# Patient Record
Sex: Female | Born: 1985 | Race: White | Hispanic: No | Marital: Single | Smoking: Never smoker
Health system: Southern US, Community
[De-identification: ages and names within clinical notes are randomized; demographics above are authoritative.]

## PROBLEM LIST (undated history)

## (undated) DIAGNOSIS — R519 Headache, unspecified: Secondary | ICD-10-CM

## (undated) DIAGNOSIS — E669 Obesity, unspecified: Secondary | ICD-10-CM

## (undated) DIAGNOSIS — F329 Major depressive disorder, single episode, unspecified: Secondary | ICD-10-CM

## (undated) DIAGNOSIS — R51 Headache: Secondary | ICD-10-CM

## (undated) DIAGNOSIS — F32A Depression, unspecified: Secondary | ICD-10-CM

## (undated) HISTORY — DX: Depression, unspecified: F32.A

## (undated) HISTORY — PX: NO PAST SURGERIES: SHX2092

## (undated) HISTORY — DX: Headache: R51

## (undated) HISTORY — DX: Headache, unspecified: R51.9

## (undated) HISTORY — DX: Obesity, unspecified: E66.9

## (undated) HISTORY — DX: Major depressive disorder, single episode, unspecified: F32.9

---

## 2010-09-11 ENCOUNTER — Emergency Department: Payer: Self-pay | Admitting: Emergency Medicine

## 2016-03-28 ENCOUNTER — Ambulatory Visit (INDEPENDENT_AMBULATORY_CARE_PROVIDER_SITE_OTHER): Payer: BLUE CROSS/BLUE SHIELD | Admitting: Physician Assistant

## 2016-03-28 ENCOUNTER — Encounter: Payer: Self-pay | Admitting: Physician Assistant

## 2016-03-28 VITALS — BP 122/78 | HR 76 | Temp 98.6°F | Resp 16 | Ht 65.0 in | Wt 253.0 lb

## 2016-03-28 DIAGNOSIS — Z114 Encounter for screening for human immunodeficiency virus [HIV]: Secondary | ICD-10-CM | POA: Diagnosis not present

## 2016-03-28 DIAGNOSIS — Z136 Encounter for screening for cardiovascular disorders: Secondary | ICD-10-CM | POA: Diagnosis not present

## 2016-03-28 DIAGNOSIS — Z833 Family history of diabetes mellitus: Secondary | ICD-10-CM

## 2016-03-28 DIAGNOSIS — Z13220 Encounter for screening for lipoid disorders: Secondary | ICD-10-CM | POA: Diagnosis not present

## 2016-03-28 DIAGNOSIS — Z0000 Encounter for general adult medical examination without abnormal findings: Secondary | ICD-10-CM | POA: Diagnosis not present

## 2016-03-28 DIAGNOSIS — R590 Localized enlarged lymph nodes: Secondary | ICD-10-CM

## 2016-03-28 DIAGNOSIS — Z124 Encounter for screening for malignant neoplasm of cervix: Secondary | ICD-10-CM | POA: Diagnosis not present

## 2016-03-28 DIAGNOSIS — Z803 Family history of malignant neoplasm of breast: Secondary | ICD-10-CM

## 2016-03-28 NOTE — Patient Instructions (Signed)

## 2016-03-28 NOTE — Addendum Note (Signed)
Addended by: Margaretann LovelessBURNETTE, Hawkin Charo M on: 03/28/2016 03:37 PM   Modules accepted: Orders

## 2016-03-28 NOTE — Progress Notes (Signed)
Patient: Rebecca Black Female    DOB: 1985-05-16   31 y.o.   MRN: [id number] Visit Date: 03/28/2016  Today's Provider: Margaretann Loveless, PA-C   Chief Complaint  Patient presents with  . New Patient (Initial Visit)  . Annual Exam   Subjective:    HPI Patient here to establish care, pt reports she has not had a physical for about 7 or 8 years.   Patient reports she is feeling well. Patient is exercises 2-3 times a week. Patient reports sleeping fairly well.   Her main complaint is that she is having a lymph node in the left axilla that has become more prominent over the last couple of months. She thinks she noticed it originally in Oct or Nov. It is only tender if she touches it often. She denies any changes in size since she first noted it. No breast nodules or lumps noted. No nipple discharge. No breast pain. No asymmetry. There is a past medical history of breast cancer in her paternal grandmother.      No Known Allergies  No current outpatient prescriptions on file.  Review of Systems  Constitutional: Negative.  Negative for appetite change.  HENT: Negative.   Eyes: Negative.   Respiratory: Negative.   Cardiovascular: Positive for leg swelling (only when she is up on her feet all day).  Gastrointestinal: Negative.   Endocrine: Negative.   Genitourinary: Negative.   Musculoskeletal: Positive for arthralgias.  Skin: Negative.   Allergic/Immunologic: Negative.   Neurological: Negative.   Hematological: Negative.   Psychiatric/Behavioral: Negative.     Social History  Substance Use Topics  . Smoking status: Never Smoker  . Smokeless tobacco: Never Used  . Alcohol use 0.6 oz/week    1 Glasses of wine per week   Objective:   BP 122/78 (BP Location: Left Arm, Patient Position: Sitting, Cuff Size: Large)   Pulse 76   Temp 98.6 F (37 C) (Oral)   Resp 16   Ht 5\' 5"  (1.651 m)   Wt 253 lb (114.8 kg)   LMP 03/23/2016 (Exact Date)   BMI 42.10 kg/m    Physical Exam  Constitutional: She is oriented to person, place, and time. She appears well-developed and well-nourished. No distress.  HENT:  Head: Normocephalic and atraumatic.  Right Ear: Hearing, tympanic membrane, external ear and ear canal normal.  Left Ear: Hearing, tympanic membrane, external ear and ear canal normal.  Nose: Nose normal.  Mouth/Throat: Uvula is midline, oropharynx is clear and moist and mucous membranes are normal. No oropharyngeal exudate.  Eyes: Conjunctivae and EOM are normal. Pupils are equal, round, and reactive to light. Right eye exhibits no discharge. Left eye exhibits no discharge. No scleral icterus.  Neck: Normal range of motion. Neck supple. No JVD present. Carotid bruit is not present. No tracheal deviation present. No thyromegaly present.  Cardiovascular: Normal rate, regular rhythm, normal heart sounds and intact distal pulses.  Exam reveals no gallop and no friction rub.   No murmur heard. Pulmonary/Chest: Effort normal and breath sounds normal. No respiratory distress. She has no wheezes. She has no rales. She exhibits no tenderness. Right breast exhibits no inverted nipple, no mass, no nipple discharge, no skin change and no tenderness. Left breast exhibits no inverted nipple, no mass, no nipple discharge, no skin change and no tenderness. Breasts are symmetrical.  Abdominal: Soft. Bowel sounds are normal. She exhibits no distension and no mass. There is no tenderness. There is  no rebound and no guarding. Hernia confirmed negative in the right inguinal area and confirmed negative in the left inguinal area.  Genitourinary: Rectum normal, vagina normal and uterus normal. No breast swelling, tenderness, discharge or bleeding. Pelvic exam was performed with patient supine. There is no rash, tenderness, lesion or injury on the right labia. There is no rash, tenderness, lesion or injury on the left labia. Cervix exhibits no motion tenderness, no discharge and no  friability. Right adnexum displays no mass, no tenderness and no fullness. Left adnexum displays no mass, no tenderness and no fullness. No erythema, tenderness or bleeding in the vagina. No signs of injury around the vagina. No vaginal discharge found.  Musculoskeletal: Normal range of motion. She exhibits no edema or tenderness.  Lymphadenopathy:    She has no cervical adenopathy.    She has axillary adenopathy.       Left axillary: Lateral (slightly enlarged, non-tender) adenopathy present.       Right: No inguinal adenopathy present.       Left: No inguinal adenopathy present.  Neurological: She is alert and oriented to person, place, and time. She has normal reflexes. No cranial nerve deficit. Coordination normal.  Skin: Skin is warm and dry. No rash noted. She is not diaphoretic.  Psychiatric: She has a normal mood and affect. Her behavior is normal. Judgment and thought content normal.  Vitals reviewed.      Assessment & Plan:     1. Annual physical exam Normal physical exam today. Will check labs as below and f/u pending lab results. If labs are stable and WNL she will not need to have these rechecked for one year at her next annual physical exam. She is to call the office in the meantime if she has any acute issue, questions or concerns. - CBC with Differential - Comprehensive metabolic panel - TSH  2. Cervical cancer screening Pap collected today. Will send as below and f/u pending results. - Pap IG w/ reflex to HPV when [facility]-U ([facility])  3. Screening for HIV (human immunodeficiency virus) - HIV antibody  4. Axillary lymphadenopathy Slightly enlarged lateral axillary lymph node in the left axilla. Will get US as below for further evaluation. Patient does have family history of breast cancer in her paternal grandmother. No breast changes or nodules palpated. Otherwise unremarkable breast exam today. - US BREAST COMPLETE UNI LEFT INC AXILLA; Future  5. Family  history of breast cancer See above medical treatment plan. - US BREAST COMPLETE UNI LEFT INC AXILLA; Future  6. Family history of diabetes mellitus Will check labs as below and f/u pending results. - Hemoglobin A1c  7. Encounter for lipid screening for cardiovascular disease Will check labs as below and f/u pending results. - Lipid panel       Margaretann LovelessJennifer M Charan Prieto, PA-C  [facility]

## 2016-03-29 ENCOUNTER — Telehealth: Payer: Self-pay

## 2016-03-29 LAB — LIPID PANEL
CHOL/HDL RATIO: 2.7 ratio (ref 0.0–4.4)
CHOLESTEROL TOTAL: 145 mg/dL (ref 100–199)
HDL: 54 mg/dL (ref >39)
LDL CALC: 81 mg/dL (ref 0–99)
Triglycerides: 48 mg/dL (ref 0–149)
VLDL CHOLESTEROL CAL: 10 mg/dL (ref 5–40)

## 2016-03-29 LAB — COMPREHENSIVE METABOLIC PANEL
A/G RATIO: 1.4 (ref 1.2–2.2)
ALBUMIN: 4.3 g/dL (ref 3.5–5.5)
ALT: 19 IU/L (ref 0–32)
AST: 24 IU/L (ref 0–40)
Alkaline Phosphatase: 58 IU/L (ref 39–117)
BUN / CREAT RATIO: 29 — AB (ref 9–23)
BUN: 20 mg/dL (ref 6–20)
Bilirubin Total: 0.3 mg/dL (ref 0.0–1.2)
CALCIUM: 8.9 mg/dL (ref 8.7–10.2)
CO2: 25 mmol/L (ref 18–29)
Chloride: 99 mmol/L (ref 96–106)
Creatinine, Ser: 0.69 mg/dL (ref 0.57–1.00)
GFR, EST AFRICAN AMERICAN: 136 mL/min/{1.73_m2} (ref >59)
GFR, EST NON AFRICAN AMERICAN: 118 mL/min/{1.73_m2} (ref >59)
GLOBULIN, TOTAL: 3 g/dL (ref 1.5–4.5)
Glucose: 91 mg/dL (ref 65–99)
POTASSIUM: 4.2 mmol/L (ref 3.5–5.2)
Sodium: 138 mmol/L (ref 134–144)
TOTAL PROTEIN: 7.3 g/dL (ref 6.0–8.5)

## 2016-03-29 LAB — CBC WITH DIFFERENTIAL/PLATELET
BASOS: 0 %
Basophils Absolute: 0 10*3/uL (ref 0.0–0.2)
EOS (ABSOLUTE): 0 10*3/uL (ref 0.0–0.4)
EOS: 0 %
HEMATOCRIT: 41 % (ref 34.0–46.6)
HEMOGLOBIN: 13.2 g/dL (ref 11.1–15.9)
IMMATURE GRANS (ABS): 0 10*3/uL (ref 0.0–0.1)
IMMATURE GRANULOCYTES: 0 %
LYMPHS: 19 %
Lymphocytes Absolute: 1.9 10*3/uL (ref 0.7–3.1)
MCH: 28.6 pg (ref 26.6–33.0)
MCHC: 32.2 g/dL (ref 31.5–35.7)
MCV: 89 fL (ref 79–97)
Monocytes Absolute: 0.6 10*3/uL (ref 0.1–0.9)
Monocytes: 6 %
NEUTROS ABS: 7.1 10*3/uL — AB (ref 1.4–7.0)
NEUTROS PCT: 75 %
PLATELETS: 275 10*3/uL (ref 150–379)
RBC: 4.62 x10E6/uL (ref 3.77–5.28)
RDW: 13.6 % (ref 12.3–15.4)
WBC: 9.6 10*3/uL (ref 3.4–10.8)

## 2016-03-29 LAB — PAP IG W/ RFLX HPV ASCU: PAP SMEAR COMMENT: 0

## 2016-03-29 LAB — HIV ANTIBODY (ROUTINE TESTING W REFLEX): HIV SCREEN 4TH GENERATION: NONREACTIVE

## 2016-03-29 LAB — TSH: TSH: 3.1 u[IU]/mL (ref 0.450–4.500)

## 2016-03-29 LAB — HEMOGLOBIN A1C
Est. average glucose Bld gHb Est-mCnc: 97 mg/dL
Hgb A1c MFr Bld: 5 % (ref 4.8–5.6)

## 2016-03-29 NOTE — Telephone Encounter (Signed)
Advised pt of lab results. Pt verbally acknowledges understanding. Rehaan Viloria Drozdowski, CMA   

## 2016-03-29 NOTE — Telephone Encounter (Signed)
-----   Message from Margaretann LovelessJennifer M Burnette, PA-C sent at 03/29/2016  8:25 AM EST ----- All labs are within normal limits and stable.  Thanks! -JB

## 2016-04-01 ENCOUNTER — Telehealth: Payer: Self-pay

## 2016-04-01 NOTE — Telephone Encounter (Signed)
-----   Message from Margaretann LovelessJennifer M Burnette, [place]-C sent at 03/29/2016  5:21 PM EST ----- Pap is normal.  Will repeat in 3 years.

## 2016-04-01 NOTE — Telephone Encounter (Signed)
Patient advised as directed below.  Thanks,  -Nicoli Nardozzi 

## 2016-04-11 ENCOUNTER — Ambulatory Visit: Payer: Self-pay

## 2016-04-18 ENCOUNTER — Ambulatory Visit
Admission: RE | Admit: 2016-04-18 | Discharge: 2016-04-18 | Disposition: A | Discharge status: Home / Self Care | Payer: BLUE CROSS/BLUE SHIELD | Source: Ambulatory Visit | Attending: Physician Assistant | Admitting: Physician Assistant

## 2016-04-18 DIAGNOSIS — Z803 Family history of malignant neoplasm of breast: Secondary | ICD-10-CM | POA: Diagnosis present

## 2016-04-18 DIAGNOSIS — R590 Localized enlarged lymph nodes: Secondary | ICD-10-CM | POA: Insufficient documentation

## 2017-06-25 IMAGING — US US BREAST*L* LIMITED INC AXILLA
1 series · 4 of 4 positions shown · non-contrast
Comparison: None.

CLINICAL DATA: Left axillary area of palpable concern and
clinically detected enlargement.

EXAM:
ULTRASOUND OF THE LEFT BREAST

[Series 1: us breast*left* limited inc axilla · 0.06mm/px · 4 of 4 slices shown]
[im 1/4]
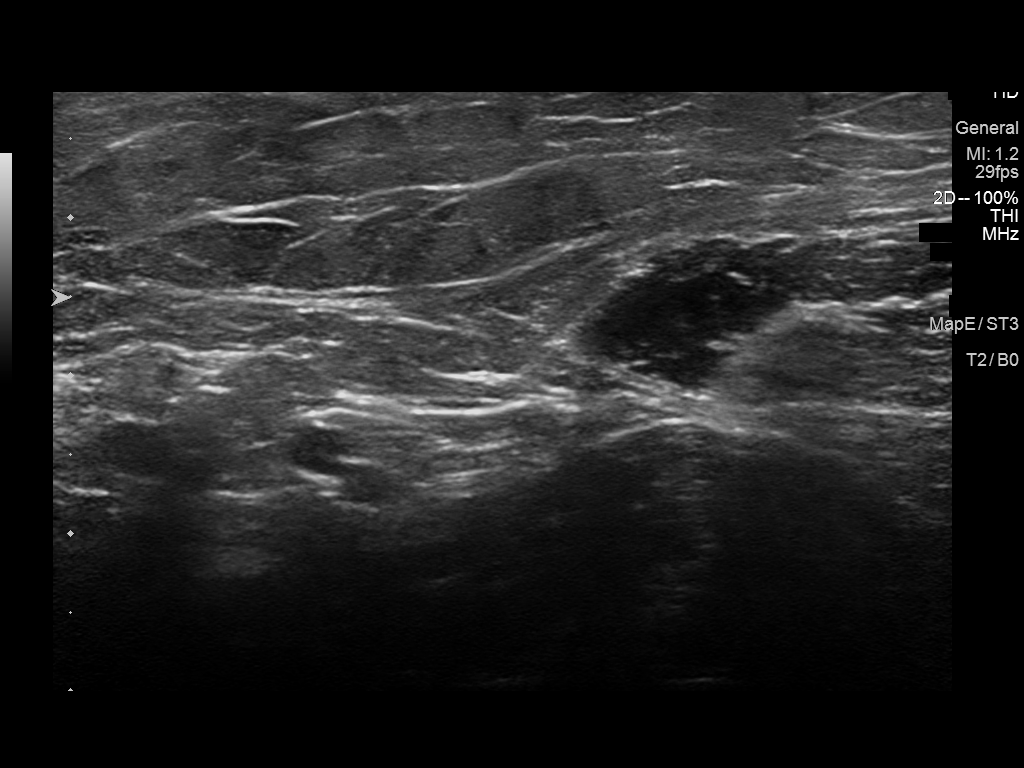
[im 2/4]
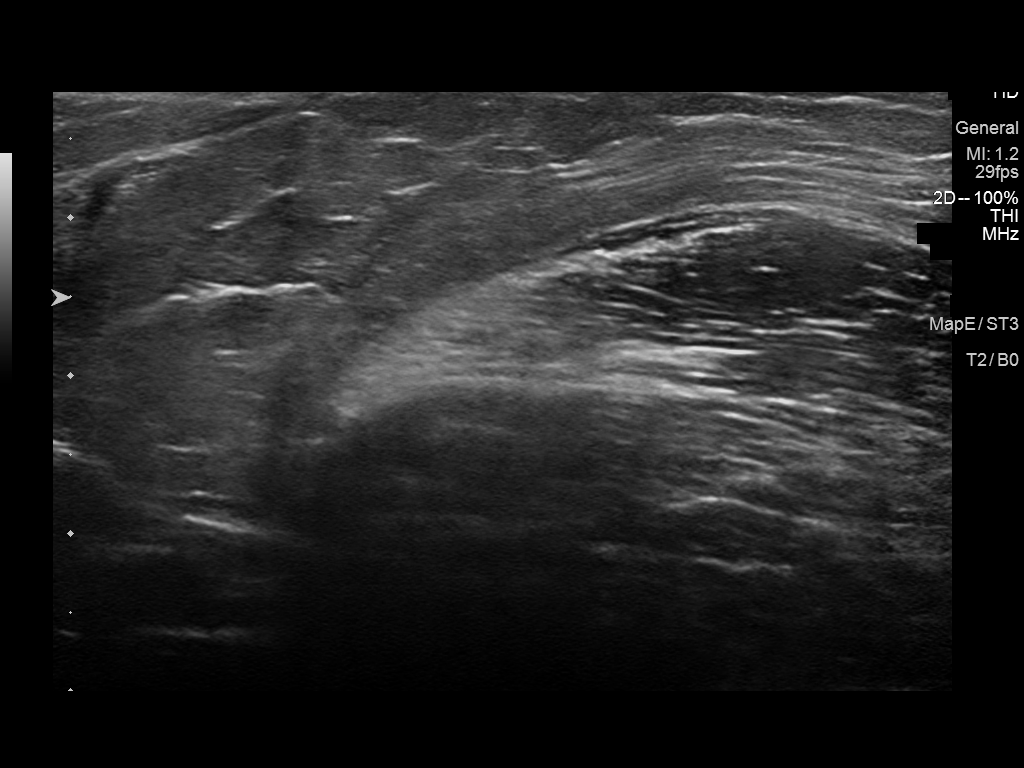
[im 3/4]
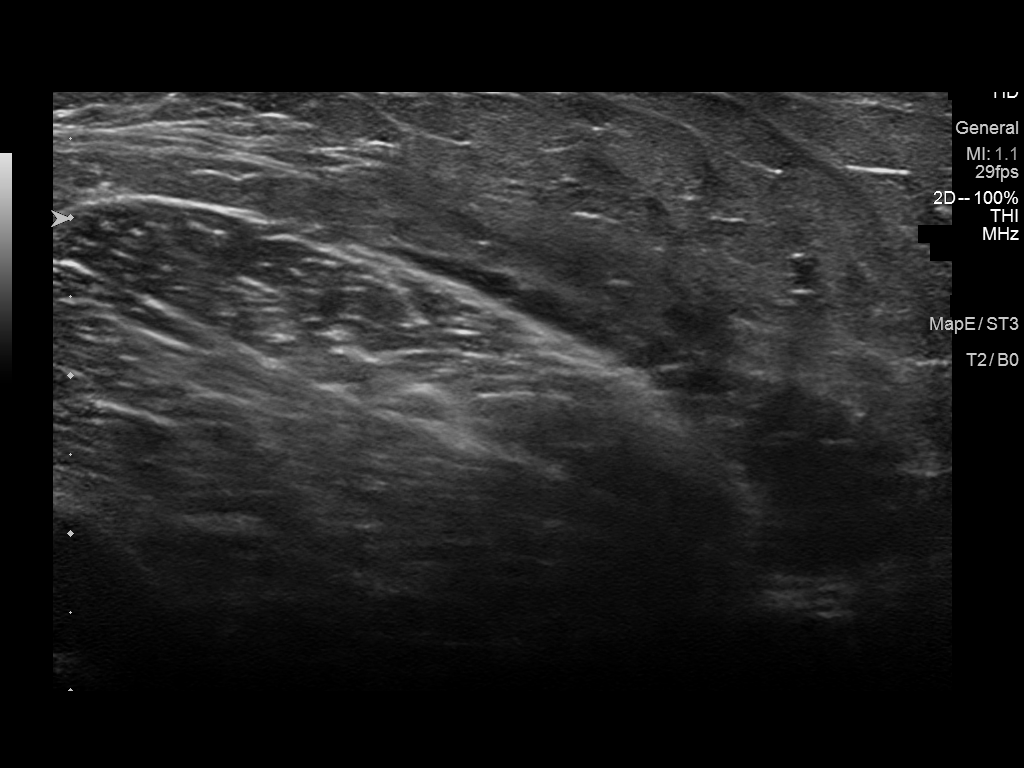
[im 4/4]
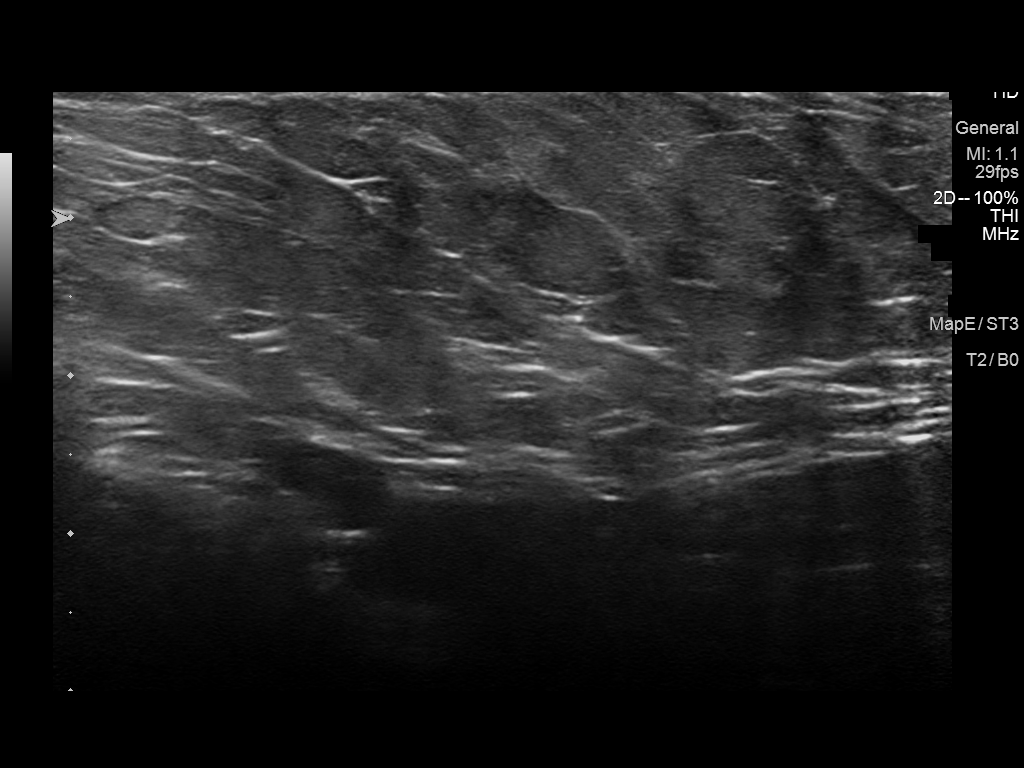

[4 of 4 positions shown; findings below may reference images not displayed]

FINDINGS: On physical exam, no suspicious masses are palpated in the left
axilla.

Targeted ultrasound is performed, showing no suspicious masses or
evidence of lymphadenopathy.
IMPRESSION: Normal targeted left axillary ultrasound.

RECOMMENDATION:
Screening mammogram at age 41 unless there are persistent or
intervening clinical concerns. (Code:PB-[id number])

Further management of patient's area of palpable concern in the left
axilla should be based on clinical grounds.

I have discussed the findings and recommendations with the patient.
Results were also provided in writing at the conclusion of the
visit. If applicable, a reminder letter will be sent to the patient
regarding the next appointment.

BI-RADS CATEGORY  1: Negative.

## 2021-08-24 NOTE — Progress Notes (Signed)
New patient visit   Patient: Rebecca Black   DOB: March 20, 1986   36 y.o. Female  MRN: [id number] Visit Date: 08/28/2021  Today's healthcare provider: Gwyneth Sprout, FNP  Patient presents for new patient visit to establish care.  Introduced to Designer, jewellery role and practice setting.  All questions answered.  Discussed provider/patient relationship and expectations.   I,Tiffany J Bragg,acting as a scribe for Gwyneth Sprout, FNP.,have documented all relevant documentation on the behalf of Gwyneth Sprout, FNP,as directed by  Gwyneth Sprout, FNP while in the presence of Gwyneth Sprout, FNP.   Chief Complaint  Patient presents with   Annual Exam   Foot Swelling    Patient complains of R foot swelling, bruising and spider veins for 6 months.   Subjective    ALEYAH Black is a 36 y.o. female who presents today as a new patient to establish care.  HPI HPI     Foot Swelling    Additional comments: Patient complains of R foot swelling, bruising and spider veins for 6 months.      Last edited by Smitty Knudsen, CMA on 08/28/2021  2:05 PM.       History reviewed. No pertinent past medical history.  History reviewed. No pertinent surgical history. Family Status  Relation Name Status   Mother  Alive   Father  Alive   Sister  Alive   PGM  (Not Specified)   PGF  (Not Specified)   Family History  Problem Relation Age of Onset   Depression Mother    Hypertension Mother    Diabetes Father    Breast cancer Paternal Grandmother    Diabetes Paternal Grandfather    Social History   Socioeconomic History   Marital status: Single    Spouse name: Not on file   Number of children: 0   Years of education: 12   Highest education level: Not on file  Occupational History    Employer: [employer]  Tobacco Use   Smoking status: Never   Smokeless tobacco: Never  Substance and Sexual Activity   Alcohol use: Yes    Alcohol/week: 1.0 standard drink    Types: 1 Glasses of wine per  week   Drug use: No   Sexual activity: Never  Other Topics Concern   Not on file  Social History Narrative   Not on file   Social Determinants of Health   Financial Resource Strain: Not on file  Food Insecurity: Not on file  Transportation Needs: Not on file  Physical Activity: Not on file  Stress: Not on file  Social Connections: Not on file   No outpatient medications prior to visit.   No facility-administered medications prior to visit.   No Known Allergies   There is no immunization history on file for this patient.  Health Maintenance  Topic Date Due   COVID-19 Vaccine (1) Never done   Hepatitis C Screening  Never done   TETANUS/TDAP  Never done   PAP SMEAR-Modifier  03/29/2019   INFLUENZA VACCINE  10/23/2021   HIV Screening  Completed   HPV VACCINES  Aged Out    Patient Care Team: Gwyneth Sprout, FNP as PCP - General (Family Medicine)  Review of Systems  Cardiovascular:  Positive for leg swelling.  Musculoskeletal:  Positive for arthralgias and back pain.  Neurological:  Positive for light-headedness.  All other systems reviewed and are negative.     Objective  BP 134/66 (BP Location: Right Arm, Patient Position: Sitting, Cuff Size: Large)   Pulse 75   Temp 98.5 F (36.9 C) (Oral)   Resp 16   Ht 5\' 4"  (1.626 m)   Wt (!) 300 lb 4.8 oz (136.2 kg)   LMP 08/28/2021   SpO2 100%   BMI 51.55 kg/m    Physical Exam Vitals and nursing note reviewed.  Constitutional:      General: She is awake. She is not in acute distress.    Appearance: Normal appearance. She is well-developed and well-groomed. She is obese. She is not ill-appearing, toxic-appearing or diaphoretic.  HENT:     Head: Normocephalic and atraumatic.     Jaw: There is normal jaw occlusion. No trismus, tenderness, swelling or pain on movement.     Right Ear: Hearing, tympanic membrane, ear canal and external ear normal. There is no impacted cerumen.     Left Ear: Hearing, tympanic  membrane, ear canal and external ear normal. There is no impacted cerumen.     Nose: Nose normal. No congestion or rhinorrhea.     Right Turbinates: Not enlarged, swollen or pale.     Left Turbinates: Not enlarged, swollen or pale.     Right Sinus: No maxillary sinus tenderness or frontal sinus tenderness.     Left Sinus: No maxillary sinus tenderness or frontal sinus tenderness.     Mouth/Throat:     Lips: Pink.     Mouth: Mucous membranes are moist. No injury.     Tongue: No lesions.     Pharynx: Oropharynx is clear. Uvula midline. No pharyngeal swelling, oropharyngeal exudate, posterior oropharyngeal erythema or uvula swelling.     Tonsils: No tonsillar exudate or tonsillar abscesses.  Eyes:     General: Lids are normal. Lids are everted, no foreign bodies appreciated. Vision grossly intact. Gaze aligned appropriately. No allergic shiner or visual field deficit.       Right eye: No discharge.        Left eye: No discharge.     Extraocular Movements: Extraocular movements intact.     Conjunctiva/sclera: Conjunctivae normal.     Right eye: Right conjunctiva is not injected. No exudate.    Left eye: Left conjunctiva is not injected. No exudate.    Pupils: Pupils are equal, round, and reactive to light.  Neck:     Thyroid: No thyroid mass, thyromegaly or thyroid tenderness.     Vascular: No carotid bruit.     Trachea: Trachea normal.  Cardiovascular:     Rate and Rhythm: Normal rate and regular rhythm.     Pulses: Normal pulses.          Carotid pulses are 2+ on the right side and 2+ on the left side.      Radial pulses are 2+ on the right side and 2+ on the left side.       Dorsalis pedis pulses are 2+ on the right side and 2+ on the left side.       Posterior tibial pulses are 2+ on the right side and 2+ on the left side.     Heart sounds: Normal heart sounds, S1 normal and S2 normal. No murmur heard.   No friction rub. No gallop.  Pulmonary:     Effort: Pulmonary effort is  normal. No respiratory distress.     Breath sounds: Normal breath sounds and air entry. No stridor. No wheezing, rhonchi or rales.  Chest:     Chest  wall: No tenderness.     Comments: Breasts: risk and benefit of breast self-exam was discussed, not examined, patient declines to have breast exam  Abdominal:     General: Abdomen is flat. Bowel sounds are normal. There is no distension.     Palpations: Abdomen is soft. There is no mass.     Tenderness: There is no abdominal tenderness. There is no right CVA tenderness, left CVA tenderness, guarding or rebound.     Hernia: No hernia is present.  Genitourinary:    Comments: Exam deferred; denies complaints Musculoskeletal:        General: No swelling, tenderness, deformity or signs of injury. Normal range of motion.     Cervical back: Full passive range of motion without pain, normal range of motion and neck supple. No edema, rigidity or tenderness. No muscular tenderness.     Right lower leg: No edema.     Left lower leg: No edema.  Lymphadenopathy:     Cervical: No cervical adenopathy.     Right cervical: No superficial, deep or posterior cervical adenopathy.    Left cervical: No superficial, deep or posterior cervical adenopathy.  Skin:    General: Skin is warm and dry.     Capillary Refill: Capillary refill takes less than 2 seconds.     Coloration: Skin is not jaundiced or pale.     Findings: No bruising, erythema, lesion or rash.  Neurological:     General: No focal deficit present.     Mental Status: She is alert and oriented to person, place, and time. Mental status is at baseline.     GCS: GCS eye subscore is 4. GCS verbal subscore is 5. GCS motor subscore is 6.     Sensory: Sensation is intact. No sensory deficit.     Motor: Motor function is intact. No weakness.     Coordination: Coordination is intact. Coordination normal.     Gait: Gait is intact. Gait normal.  Psychiatric:        Attention and Perception: Attention and  perception normal.        Mood and Affect: Mood and affect normal.        Speech: Speech normal.        Behavior: Behavior normal. Behavior is cooperative.        Thought Content: Thought content normal.        Cognition and Memory: Cognition and memory normal.        Judgment: Judgment normal.    Depression Screen    08/28/2021    2:00 PM 03/28/2016    2:12 PM  PHQ 2/9 Scores  PHQ - 2 Score 3 1  PHQ- 9 Score 8    No results found for any visits on 08/28/21.  Assessment & Plan      Problem List Items Addressed This Visit       Cardiovascular and Mediastinum   Spider varicose veins    Acute on chronic, stable -will refer to vascular for [place] -encourage use of compression stocking -encourage increase in water -encourage low-salt diet -recommend support shoes/wide toe box Reassurance provided, normal temperature, normal pulses, non pitting presentation. Nothing is red/inflammed, warm/tight, hard.        Relevant Orders   Ambulatory referral to Vascular Surgery     Other   Annual physical exam - Primary    Recommend annual vision and dental screening. Things to do to keep yourself healthy  - Exercise at least 30-45 minutes a  day, 3-4 days a week.  - Eat a low-fat diet with lots of fruits and vegetables, up to 7-9 servings per day.  - Seatbelts can save your life. Wear them always.  - Smoke detectors on every level of your home, check batteries every year.  - Eye Doctor - have an eye exam every 1-2 years  - Safe sex - if you may be exposed to STDs, use a condom.  - Alcohol -  If you drink, do it moderately, less than 2 drinks per day.  - [place]. Choose someone to speak for you if you are not able.  - Depression is common in our stressful world.If you're feeling down or losing interest in things you normally enjoy, please come in for a visit.  - Violence - If anyone is threatening or hurting you, please call immediately.         Relevant Orders    Comprehensive Metabolic Panel (CMET)   TSH + free T4   CBC with Differential/Platelet   Lipid panel   Encounter for hepatitis C screening test for low risk patient    Low risk screen Treatable, and curable. If left untreated Hep C can lead to cirrhosis and liver failure. Encourage routine testing; recommend repeat testing if risk factors change.       Relevant Orders   Hepatitis C Antibody   Epigastric pain    Chronic, intermittent, stable Worse with spicy food, can be triggered for up 2 weeks Eats 2 meals a day Does not eat until after noon, does drink water daily Continue to recommend balanced, lower carb meals. Smaller meal size, adding snacks. Choosing water as drink of choice and increasing purposeful exercise. Recommend treat as GERD Denies constipation       Relevant Medications   pantoprazole (PROTONIX) 40 MG tablet   sucralfate (CARAFATE) 1 g tablet   Family history of diabetes mellitus in father    Patient reports history of DM in father       Relevant Orders   Hemoglobin A1c   Family history of hypertension in mother    Patient reports her mom has HTN       Morbid obesity ([place])    Chronic, stable Body mass index is 51.55 kg/m. Discussed importance of healthy weight management Discussed diet and exercise Previously seen by bariatrics, and started on medication that assisted her in losing 10# Has thought about bariatric surgery but is still on the fence       Relevant Orders   Ambulatory referral to Vascular Surgery   Hemoglobin A1c   Lipid panel   Vitamin D (25 hydroxy)   Positive screening for depression on 9-item Patient Health Questionnaire (PHQ-9)    Chronic, stable Denies depression Reports feelings of frustration and anxiety when things aren't going her way Concerns when she puts in effort and it is not reciprocal          Return in about 1 year (around 08/29/2022) for annual examination.    Vonna Kotyk, FNP, have reviewed all  documentation for this visit. The documentation on 08/28/21 for the exam, diagnosis, procedures, and orders are all accurate and complete.    Gwyneth Sprout, [place] [phone] (phone) [phone] (fax)  [place]

## 2021-08-28 ENCOUNTER — Encounter: Payer: Self-pay | Admitting: Family Medicine

## 2021-08-28 ENCOUNTER — Ambulatory Visit: Payer: 59 | Admitting: Family Medicine

## 2021-08-28 VITALS — BP 134/66 | HR 75 | Temp 98.5°F | Resp 16 | Ht 64.0 in | Wt 300.3 lb

## 2021-08-28 DIAGNOSIS — Z1331 Encounter for screening for depression: Secondary | ICD-10-CM

## 2021-08-28 DIAGNOSIS — Z833 Family history of diabetes mellitus: Secondary | ICD-10-CM

## 2021-08-28 DIAGNOSIS — I868 Varicose veins of other specified sites: Secondary | ICD-10-CM | POA: Diagnosis not present

## 2021-08-28 DIAGNOSIS — R1013 Epigastric pain: Secondary | ICD-10-CM | POA: Diagnosis not present

## 2021-08-28 DIAGNOSIS — Z0000 Encounter for general adult medical examination without abnormal findings: Secondary | ICD-10-CM | POA: Diagnosis not present

## 2021-08-28 DIAGNOSIS — Z1159 Encounter for screening for other viral diseases: Secondary | ICD-10-CM

## 2021-08-28 DIAGNOSIS — Z8249 Family history of ischemic heart disease and other diseases of the circulatory system: Secondary | ICD-10-CM

## 2021-08-28 DIAGNOSIS — R609 Edema, unspecified: Secondary | ICD-10-CM | POA: Insufficient documentation

## 2021-08-28 MED ORDER — SUCRALFATE 1 G PO TABS: 1.0000 g | ORAL_TABLET | Freq: Three times a day (TID) | ORAL | 1 refills | Status: DC

## 2021-08-28 MED ORDER — PANTOPRAZOLE SODIUM 40 MG PO TBEC: 40.0000 mg | DELAYED_RELEASE_TABLET | Freq: Two times a day (BID) | ORAL | 1 refills | Status: DC

## 2021-08-28 NOTE — Assessment & Plan Note (Signed)
Chronic, intermittent, stable Worse with spicy food, can be triggered for up 2 weeks Eats 2 meals a day Does not eat until after noon, does drink water daily Continue to recommend balanced, lower carb meals. Smaller meal size, adding snacks. Choosing water as drink of choice and increasing purposeful exercise. Recommend treat as GERD Denies constipation

## 2021-08-28 NOTE — Assessment & Plan Note (Signed)
Chronic, stable Body mass index is 51.55 kg/m. Discussed importance of healthy weight management Discussed diet and exercise Previously seen by bariatrics, and started on medication that assisted her in losing 10# Has thought about bariatric surgery but is still on the fence

## 2021-08-28 NOTE — Assessment & Plan Note (Signed)
Recommend annual vision and dental screening. Things to do to keep yourself healthy  - Exercise at least 30-45 minutes a day, 3-4 days a week.  - Eat a low-fat diet with lots of fruits and vegetables, up to 7-9 servings per day.  - Seatbelts can save your life. Wear them always.  - Smoke detectors on every level of your home, check batteries every year.  - Eye Doctor - have an eye exam every 1-2 years  - Safe sex - if you may be exposed to STDs, use a condom.  - Alcohol -  If you drink, do it moderately, less than 2 drinks per day.  - Health Care Power of Attorney. Choose someone to speak for you if you are not able.  - Depression is common in our stressful world.If you're feeling down or losing interest in things you normally enjoy, please come in for a visit.  - Violence - If anyone is threatening or hurting you, please call immediately.

## 2021-08-28 NOTE — Assessment & Plan Note (Addendum)
Patient reports her mom has HTN

## 2021-08-28 NOTE — Patient Instructions (Signed)
Please schedule your PAP at your earliest convenience.

## 2021-08-28 NOTE — Assessment & Plan Note (Signed)
Acute on chronic, stable -will refer to vascular for [place] -encourage use of compression stocking -encourage increase in water -encourage low-salt diet -recommend support shoes/wide toe box Reassurance provided, normal temperature, normal pulses, non pitting presentation. Nothing is red/inflammed, warm/tight, hard.

## 2021-08-28 NOTE — Assessment & Plan Note (Signed)
Patient reports history of DM in father

## 2021-08-28 NOTE — Assessment & Plan Note (Signed)
Chronic, stable Denies depression Reports feelings of frustration and anxiety when things aren't going her way Concerns when she puts in effort and it is not reciprocal

## 2021-08-28 NOTE — Assessment & Plan Note (Signed)
Low risk screen Treatable, and curable. If left untreated Hep C can lead to cirrhosis and liver failure. Encourage routine testing; recommend repeat testing if risk factors change.  

## 2021-08-29 ENCOUNTER — Encounter: Payer: Self-pay | Admitting: Family Medicine

## 2021-08-29 ENCOUNTER — Other Ambulatory Visit: Payer: Self-pay | Admitting: Family Medicine

## 2021-08-29 DIAGNOSIS — R1013 Epigastric pain: Secondary | ICD-10-CM

## 2021-08-29 DIAGNOSIS — F419 Anxiety disorder, unspecified: Secondary | ICD-10-CM

## 2021-08-29 LAB — COMPREHENSIVE METABOLIC PANEL
ALT: 13 IU/L (ref 0–32)
AST: 16 IU/L (ref 0–40)
Albumin/Globulin Ratio: 1.4 (ref 1.2–2.2)
Albumin: 4.2 g/dL (ref 3.8–4.8)
Alkaline Phosphatase: 56 IU/L (ref 44–121)
BUN/Creatinine Ratio: 15 (ref 9–23)
BUN: 12 mg/dL (ref 6–20)
Bilirubin Total: 0.6 mg/dL (ref 0.0–1.2)
CO2: 24 mmol/L (ref 20–29)
Calcium: 9.1 mg/dL (ref 8.7–10.2)
Chloride: 101 mmol/L (ref 96–106)
Creatinine, Ser: 0.78 mg/dL (ref 0.57–1.00)
Globulin, Total: 2.9 g/dL (ref 1.5–4.5)
Glucose: 83 mg/dL (ref 70–99)
Potassium: 4.1 mmol/L (ref 3.5–5.2)
Sodium: 136 mmol/L (ref 134–144)
Total Protein: 7.1 g/dL (ref 6.0–8.5)
eGFR: 102 mL/min/{1.73_m2} (ref >59)

## 2021-08-29 LAB — CBC WITH DIFFERENTIAL/PLATELET
Basophils Absolute: 0 10*3/uL (ref 0.0–0.2)
Basos: 1 %
EOS (ABSOLUTE): 0.1 10*3/uL (ref 0.0–0.4)
Eos: 1 %
Hematocrit: 40.9 % (ref 34.0–46.6)
Hemoglobin: 13.5 g/dL (ref 11.1–15.9)
Immature Grans (Abs): 0 10*3/uL (ref 0.0–0.1)
Immature Granulocytes: 0 %
Lymphocytes Absolute: 1.8 10*3/uL (ref 0.7–3.1)
Lymphs: 22 %
MCH: 29 pg (ref 26.6–33.0)
MCHC: 33 g/dL (ref 31.5–35.7)
MCV: 88 fL (ref 79–97)
Monocytes Absolute: 0.6 10*3/uL (ref 0.1–0.9)
Monocytes: 8 %
Neutrophils Absolute: 5.7 10*3/uL (ref 1.4–7.0)
Neutrophils: 68 %
Platelets: 235 10*3/uL (ref 150–450)
RBC: 4.65 x10E6/uL (ref 3.77–5.28)
RDW: 12.8 % (ref 11.7–15.4)
WBC: 8.3 10*3/uL (ref 3.4–10.8)

## 2021-08-29 LAB — LIPID PANEL
Chol/HDL Ratio: 2.8 ratio (ref 0.0–4.4)
Cholesterol, Total: 133 mg/dL (ref 100–199)
HDL: 48 mg/dL (ref >39)
LDL Chol Calc (NIH): 70 mg/dL (ref 0–99)
Triglycerides: 76 mg/dL (ref 0–149)
VLDL Cholesterol Cal: 15 mg/dL (ref 5–40)

## 2021-08-29 LAB — HEMOGLOBIN A1C
Est. average glucose Bld gHb Est-mCnc: 108 mg/dL
Hgb A1c MFr Bld: 5.4 % (ref 4.8–5.6)

## 2021-08-29 LAB — TSH+FREE T4
Free T4: 1.1 ng/dL (ref 0.82–1.77)
TSH: 2.79 u[IU]/mL (ref 0.450–4.500)

## 2021-08-29 LAB — VITAMIN D 25 HYDROXY (VIT D DEFICIENCY, FRACTURES): Vit D, 25-Hydroxy: 27.3 ng/mL — ABNORMAL LOW (ref 30.0–100.0)

## 2021-08-29 LAB — HEPATITIS C ANTIBODY: Hep C Virus Ab: NONREACTIVE

## 2021-08-29 MED ORDER — SUCRALFATE 1 G PO TABS: 1.0000 g | ORAL_TABLET | Freq: Three times a day (TID) | ORAL | 1 refills | Status: DC

## 2021-08-29 MED ORDER — ESCITALOPRAM OXALATE 10 MG PO TABS: 10.0000 mg | ORAL_TABLET | Freq: Every day | ORAL | 1 refills | Status: DC

## 2021-08-29 MED ORDER — PANTOPRAZOLE SODIUM 40 MG PO TBEC: 40.0000 mg | DELAYED_RELEASE_TABLET | Freq: Two times a day (BID) | ORAL | 1 refills | Status: DC

## 2022-03-31 ENCOUNTER — Ambulatory Visit
Admission: EM | Admit: 2022-03-31 | Discharge: 2022-03-31 | Disposition: A | Discharge status: Home / Self Care | Payer: 59 | Attending: Nurse Practitioner | Admitting: Nurse Practitioner

## 2022-03-31 DIAGNOSIS — H109 Unspecified conjunctivitis: Secondary | ICD-10-CM

## 2022-03-31 MED ORDER — POLYMYXIN B-TRIMETHOPRIM 10000-0.1 UNIT/ML-% OP SOLN: 1.0000 [drp] | Freq: Four times a day (QID) | OPHTHALMIC | 0 refills | Status: AC

## 2022-03-31 NOTE — ED Triage Notes (Signed)
Pt presents with right eye irritation, drainage, and soreness since last night.

## 2022-03-31 NOTE — ED Provider Notes (Signed)
[facility]    CSN: [id number] Arrival date & time: 03/31/22  0809      History   Chief Complaint Chief Complaint  Patient presents with   Conjunctivitis    HPI Rebecca Black is a 37 y.o. female who presents for evaluation of an eye complaint for the past 1 day. Patient reports right eye redness with irritation and purulent discharge and denies  loss or change in vision, itching of the eye, photophobia, foreign body sensation, trauma or pain. Reports no URI or allergy sx. she did recently return from [place].  Does not wear contacts or glasses.  Patient has taken nothing OTC for symptoms. Pt has no other concerns at this time.    Conjunctivitis    History reviewed. No pertinent past medical history.  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 No pertinent surgical history.  OB History     Gravida  0   Para  0   Term  0   Preterm  0   AB  0   Living  0      SAB  0   IAB  0   Ectopic  0   Multiple  0   Live Births  0            Home Medications    Prior to Admission medications   Medication Sig Start Date End Date Taking? Authorizing Provider  trimethoprim-polymyxin b (POLYTRIM) ophthalmic solution Place 1 drop into the right eye every 6 (six) hours for 7 days. 03/31/22 04/07/22 Yes Radford Pax, NP  escitalopram (LEXAPRO) 10 MG tablet Take 1 tablet (10 mg total) by mouth daily. 08/29/21   Jacky Kindle, FNP  pantoprazole (PROTONIX) 40 MG tablet Take 1 tablet (40 mg  total) by mouth 2 (two) times daily before a meal. 08/29/21   Jacky Kindle, FNP  sucralfate (CARAFATE) 1 g tablet Take 1 tablet (1 g total) by mouth 4 (four) times daily -  with meals and at bedtime. 08/29/21   Jacky Kindle, FNP    Family History Family History  Problem Relation Age of Onset   Depression Mother    Hypertension Mother    Diabetes Father    Breast cancer Paternal Grandmother    Diabetes Paternal Grandfather     Social History Social History   Tobacco Use   Smoking status: Never   Smokeless tobacco: Never  Substance Use Topics   Alcohol use: Yes    Alcohol/week: 1.0 standard drink of alcohol    Types: 1 Glasses of wine per week   Drug use: No     Allergies   Patient has no known allergies.   Review of Systems Review of Systems  Eyes:  Positive for discharge and redness.     Physical Exam Triage Vital Signs ED Triage Vitals  Enc Vitals Group     BP 03/31/22 0829 126/80     Pulse Rate 03/31/22 0829 85     Resp 03/31/22 0829 18  Temp 03/31/22 0829 98.3 F (36.8 C)     Temp Source 03/31/22 0829 Oral     SpO2 03/31/22 0829 96 %     Weight --      Height --      Head Circumference --      Peak Flow --      Pain Score 03/31/22 0831 4     Pain Loc --      Pain Edu? --      Excl. in [place]? --    No data found.  Updated Vital Signs BP 126/80 (BP Location: Left Arm)   Pulse 85   Temp 98.3 F (36.8 C) (Oral)   Resp 18   LMP  (LMP Unknown)   SpO2 96%   Visual Acuity Right Eye Distance:   Left Eye Distance:   Bilateral Distance:    Right Eye Near:   Left Eye Near:    Bilateral Near:     Physical Exam Vitals and nursing note reviewed.  Constitutional:      General: She is not in acute distress.    Appearance: Normal appearance. She is not ill-appearing.  HENT:     Head: Normocephalic and atraumatic.     Nose: Nose normal.  Eyes:     General: Lids are normal.     Conjunctiva/sclera:     Right eye: Right conjunctiva is injected.  Exudate present. No chemosis or hemorrhage.    Left eye: Left conjunctiva is not injected. No chemosis, exudate or hemorrhage.    Pupils: Pupils are equal, round, and reactive to light.  Cardiovascular:     Rate and Rhythm: Normal rate.  Pulmonary:     Effort: Pulmonary effort is normal.  Skin:    General: Skin is warm and dry.  Neurological:     General: No focal deficit present.     Mental Status: She is alert and oriented to person, place, and time.  Psychiatric:        Mood and Affect: Mood normal.        Behavior: Behavior normal.      UC Treatments / Results  Labs (all labs ordered are listed, but only abnormal results are displayed) Labs Reviewed - No data to display  EKG   Radiology No results found.  Procedures Procedures (including critical care time)  Medications Ordered in UC Medications - No data to display  Initial Impression / Assessment and Plan / UC Course  I have reviewed the triage vital signs and the nursing notes.  Pertinent labs & imaging results that were available during my care of the patient were reviewed by me and considered in my medical decision making (see chart for details).     Patient in stable condition with no red flags on exam Reviewed exam and sx with patient Start antibiotic eyedrops Warm compresses to the eye as needed Pt verbalized understanding of instruction and all questions answered F/U with PCP in 2-3 days for re-check Strict ER precautions reviewed and patient verbalized understanding Pt was discharged in stable condition and in NAD  Final Clinical Impressions(s) / UC Diagnoses   Final diagnoses:  Bacterial conjunctivitis of right eye     Discharge Instructions      Antibiotic eyedrops as prescribed Warm compresses to the eye as needed Follow-up with PCP 2 to 3 days for recheck ER for any worsening symptoms   ED Prescriptions     Medication Sig Dispense Auth. Provider   trimethoprim-polymyxin b  (POLYTRIM)  ophthalmic solution Place 1 drop into the right eye every 6 (six) hours for 7 days. 10 mL Radford Pax, NP      PDMP not reviewed this encounter.   Radford Pax, NP 03/31/22 [phone]

## 2022-03-31 NOTE — Discharge Instructions (Signed)
Antibiotic eyedrops as prescribed Warm compresses to the eye as needed Follow-up with PCP 2 to 3 days for recheck ER for any worsening symptoms

## 2022-04-02 ENCOUNTER — Encounter: Payer: Self-pay | Admitting: Family Medicine

## 2022-07-23 NOTE — Progress Notes (Unsigned)
I,J'ya E Leza Apsey,acting as a scribe for Jacky Kindle, FNP.,have documented all relevant documentation on the behalf of Jacky Kindle, FNP,as directed by  Jacky Kindle, FNP while in the presence of Jacky Kindle, FNP.  Established patient visit  Patient: Rebecca Black   DOB: 1986-02-22   37 y.o. Female  MRN: [id number] Visit Date: 07/24/2022  Today's healthcare provider: Jacky Kindle, FNP   Chief Complaint  Patient presents with   Leg Pain   Subjective    Leg Pain  Incident onset: One month ago. The pain is present in the right ankle, right heel and right knee. The quality of the pain is described as cramping and aching. The pain is at a severity of 5/10. The pain is moderate. The pain has been Constant since onset. Associated symptoms include an inability to bear weight. Pertinent negatives include no loss of motion, loss of sensation, muscle weakness or numbness. The symptoms are aggravated by movement and weight bearing. She has tried elevation and NSAIDs for the symptoms. The treatment provided mild relief.    Fatigue: Patient complains of fatigue. Symptoms began several weeks ago. Sentinal symptom the patient feels fatigue began with: hopelessness and a lack of motivation. Symptoms of her fatigue have been headaches. Patient describes the following psychologic symptoms: stress at work.     .   Care Gaps  Patient aware she is due for a PAP screening and has intentions of scheduling an appointment with PCP.   Medications: Outpatient Medications Prior to Visit  Medication Sig   escitalopram (LEXAPRO) 10 MG tablet Take 1 tablet (10 mg total) by mouth daily.   pantoprazole (PROTONIX) 40 MG tablet Take 1 tablet (40 mg total) by mouth 2 (two) times daily before a meal.   sucralfate (CARAFATE) 1 g tablet Take 1 tablet (1 g total) by mouth 4 (four) times daily -  with meals and at bedtime.   No facility-administered medications prior to visit.    Review of Systems  Constitutional:   Positive for fatigue.  Neurological:  Negative for numbness.    Last CBC Lab Results  Component Value Date   WBC 8.3 08/28/2021   HGB 13.5 08/28/2021   HCT 40.9 08/28/2021   MCV 88 08/28/2021   MCH 29.0 08/28/2021   RDW 12.8 08/28/2021   PLT 235 08/28/2021   Last metabolic panel Lab Results  Component Value Date   GLUCOSE 83 08/28/2021   NA 136 08/28/2021   K 4.1 08/28/2021   CL 101 08/28/2021   CO2 24 08/28/2021   BUN 12 08/28/2021   CREATININE 0.78 08/28/2021   EGFR 102 08/28/2021   CALCIUM 9.1 08/28/2021   PROT 7.1 08/28/2021   ALBUMIN 4.2 08/28/2021   LABGLOB 2.9 08/28/2021   AGRATIO 1.4 08/28/2021   BILITOT 0.6 08/28/2021   ALKPHOS 56 08/28/2021   AST 16 08/28/2021   ALT 13 08/28/2021   Last lipids Lab Results  Component Value Date   CHOL 133 08/28/2021   HDL 48 08/28/2021   LDLCALC 70 08/28/2021   TRIG 76 08/28/2021   CHOLHDL 2.8 08/28/2021   Last hemoglobin A1c Lab Results  Component Value Date   HGBA1C 5.4 08/28/2021   Last thyroid functions Lab Results  Component Value Date   TSH 2.790 08/28/2021   Last vitamin D Lab Results  Component Value Date   VD25OH 27.3 (L) 08/28/2021      Objective    BP 130/83 (BP Location: Left Arm,  Patient Position: Sitting, Cuff Size: Large)   Pulse 87   Temp 98.6 F (37 C) (Oral)   Resp 14   Ht 5\' 4"  (1.626 m)   Wt (!) 307 lb (139.3 kg)   LMP 07/22/2022 (Approximate)   SpO2 96%   BMI 52.70 kg/m   BP Readings from Last 3 Encounters:  07/24/22 130/83  03/31/22 126/80  08/28/21 134/66   Wt Readings from Last 3 Encounters:  07/24/22 (!) 307 lb (139.3 kg)  08/28/21 (!) 300 lb 4.8 oz (136.2 kg)  03/28/16 253 lb (114.8 kg)   SpO2 Readings from Last 3 Encounters:  07/24/22 96%  03/31/22 96%  08/28/21 100%   Physical Exam Vitals and nursing note reviewed.  Constitutional:      General: She is not in acute distress.    Appearance: Normal appearance. She is obese. She is not ill-appearing,  toxic-appearing or diaphoretic.  HENT:     Head: Normocephalic and atraumatic.  Cardiovascular:     Rate and Rhythm: Normal rate and regular rhythm.     Pulses: Normal pulses.     Heart sounds: Normal heart sounds. No murmur heard.    No friction rub. No gallop.  Pulmonary:     Effort: Pulmonary effort is normal. No respiratory distress.     Breath sounds: Normal breath sounds. No stridor. No wheezing, rhonchi or rales.  Chest:     Chest wall: No tenderness.  Musculoskeletal:        General: No swelling, tenderness, deformity or signs of injury. Normal range of motion.     Right lower leg: No edema.     Left lower leg: No edema.     Comments: Cool calves, feet- standard per pt report Mild lipedema to BLE; visible and palpable veins to RLE   Skin:    General: Skin is warm and dry.     Capillary Refill: Capillary refill takes less than 2 seconds.     Coloration: Skin is not jaundiced or pale.     Findings: No bruising, erythema, lesion or rash.  Neurological:     General: No focal deficit present.     Mental Status: She is alert and oriented to person, place, and time. Mental status is at baseline.     Cranial Nerves: No cranial nerve deficit.     Sensory: No sensory deficit.     Motor: No weakness.     Coordination: Coordination normal.  Psychiatric:        Mood and Affect: Mood normal.        Behavior: Behavior normal.        Thought Content: Thought content normal.        Judgment: Judgment normal.    No results found for any visits on 07/24/22.  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    Acute on chronic, with inflammation in R calf, R ankle Has tried NSAIDs and elevation Secondary referral placed to vascular to assist Continue to work on weight mgmt, water intake and use of compression hose [visit [facility] to get measured]       Relevant Orders   Ambulatory referral to Vascular Surgery   Primary hypertension    Chronic, stable with diet/exercise Continue to monitor given family hx of HTN in mom      Relevant Orders  CBC with Differential/Platelet   Comprehensive Metabolic Panel (CMET)   Lipid panel     Nervous and Auditory   Neuropathy    Acute, in association with edema in RLE Will repeat vitamins labs including D and B      Relevant Orders   CBC with Differential/Platelet   Comprehensive Metabolic Panel (CMET)   Lipid panel   Hemoglobin A1c   Vitamin D (25 hydroxy)   B12 and Folate Panel   Vitamin B6   Ambulatory referral to Vascular Surgery     Other   Anxiety and depression    Chronic, stable Request to switch off lexapro to wellbutrin to assist with weight mgmt Encouraged to titrate over the next 2-3 weeks or as tolerated/current supply of medication 3 month f/u recommended       Relevant Medications   buPROPion (WELLBUTRIN XL) 150 MG 24 hr tablet   Other Relevant Orders   CBC with Differential/Platelet   Comprehensive Metabolic Panel (CMET)   Family history of diabetes mellitus in father   Relevant Orders   Hemoglobin A1c   Moderate mixed hyperlipidemia not requiring statin therapy    Chronic, slight weight gain Repeat LP LDL goal <100 recommend diet low in saturated fat and regular exercise - 30 min at least 5 times per week       Relevant Orders   Lipid panel   Morbid obesity (HCC)    Chronic, Body mass index is 52.7 kg/m. Slight weight gain Discussed importance of healthy weight management Discussed diet and exercise       Relevant Medications   phentermine 37.5 MG capsule   buPROPion (WELLBUTRIN XL) 150 MG 24 hr tablet   Other Relevant Orders   Lipid panel   Hemoglobin A1c   Vitamin D (25 hydroxy)   B12 and Folate Panel   Vitamin B6   Ambulatory referral to Vascular Surgery   Return in about 3 months (around 10/24/2022) for chonic disease management.     Leilani Merl, FNP, have reviewed all documentation for this visit. The documentation on 07/24/22 for the exam, diagnosis, procedures, and orders are all accurate and complete.  Jacky Kindle, FNP  [facility] [phone] (phone) [phone] (fax)  [facility]

## 2022-07-24 ENCOUNTER — Ambulatory Visit (INDEPENDENT_AMBULATORY_CARE_PROVIDER_SITE_OTHER): Payer: 59 | Admitting: Family Medicine

## 2022-07-24 VITALS — BP 130/83 | HR 87 | Temp 98.6°F | Resp 14 | Ht 64.0 in | Wt 307.0 lb

## 2022-07-24 DIAGNOSIS — F419 Anxiety disorder, unspecified: Secondary | ICD-10-CM

## 2022-07-24 DIAGNOSIS — F32A Depression, unspecified: Secondary | ICD-10-CM

## 2022-07-24 DIAGNOSIS — Z833 Family history of diabetes mellitus: Secondary | ICD-10-CM

## 2022-07-24 DIAGNOSIS — I80299 Phlebitis and thrombophlebitis of other deep vessels of unspecified lower extremity: Secondary | ICD-10-CM

## 2022-07-24 DIAGNOSIS — G629 Polyneuropathy, unspecified: Secondary | ICD-10-CM

## 2022-07-24 DIAGNOSIS — E782 Mixed hyperlipidemia: Secondary | ICD-10-CM

## 2022-07-24 DIAGNOSIS — I10 Essential (primary) hypertension: Secondary | ICD-10-CM

## 2022-07-24 HISTORY — DX: Polyneuropathy, unspecified: G62.9

## 2022-07-24 HISTORY — DX: Mixed hyperlipidemia: E78.2

## 2022-07-24 HISTORY — DX: Essential (primary) hypertension: I10

## 2022-07-24 HISTORY — DX: Anxiety disorder, unspecified: F41.9

## 2022-07-24 MED ORDER — BUPROPION HCL ER (XL) 150 MG PO TB24: 150.0000 mg | ORAL_TABLET | Freq: Every day | ORAL | 3 refills | Status: DC

## 2022-07-24 MED ORDER — PHENTERMINE HCL 37.5 MG PO CAPS: 37.5000 mg | ORAL_CAPSULE | ORAL | 0 refills | Status: DC

## 2022-07-24 NOTE — Assessment & Plan Note (Signed)
Chronic, stable with diet/exercise Continue to monitor given family hx of HTN in mom

## 2022-07-24 NOTE — Assessment & Plan Note (Signed)
Chronic, slight weight gain Repeat LP LDL goal <100 recommend diet low in saturated fat and regular exercise - 30 min at least 5 times per week

## 2022-07-24 NOTE — Assessment & Plan Note (Signed)
Chronic, Body mass index is 52.7 kg/m. Slight weight gain Discussed importance of healthy weight management Discussed diet and exercise

## 2022-07-24 NOTE — Assessment & Plan Note (Signed)
Acute on chronic, with inflammation in R calf, R ankle Has tried NSAIDs and elevation Secondary referral placed to vascular to assist Continue to work on weight mgmt, water intake and use of compression hose [visit [facility] to get measured]

## 2022-07-24 NOTE — Assessment & Plan Note (Signed)
Acute, in association with edema in RLE Will repeat vitamins labs including D and B

## 2022-07-24 NOTE — Assessment & Plan Note (Signed)
Chronic, stable Request to switch off lexapro to wellbutrin to assist with weight mgmt Encouraged to titrate over the next 2-3 weeks or as tolerated/current supply of medication 3 month f/u recommended

## 2022-07-25 LAB — CBC WITH DIFFERENTIAL/PLATELET
Hemoglobin: 13.7 g/dL (ref 11.1–15.9)
MCH: 29.1 pg (ref 26.6–33.0)
MCHC: 32.8 g/dL (ref 31.5–35.7)
RDW: 13 % (ref 11.7–15.4)
WBC: 7 10*3/uL (ref 3.4–10.8)

## 2022-07-25 LAB — VITAMIN D 25 HYDROXY (VIT D DEFICIENCY, FRACTURES): Vit D, 25-Hydroxy: 25.6 ng/mL — ABNORMAL LOW (ref 30.0–100.0)

## 2022-07-25 LAB — B12 AND FOLATE PANEL

## 2022-07-25 LAB — COMPREHENSIVE METABOLIC PANEL
Creatinine, Ser: 0.85 mg/dL (ref 0.57–1.00)
Potassium: 4.7 mmol/L (ref 3.5–5.2)

## 2022-07-25 LAB — LIPID PANEL: HDL: 39 mg/dL — ABNORMAL LOW (ref >39)

## 2022-07-25 NOTE — Progress Notes (Signed)
Labs normal and stable; vitamins pending. Continue to recommend balanced, lower carb meals. Smaller meal size, adding snacks. Choosing water as drink of choice and increasing purposeful exercise.

## 2022-07-25 NOTE — Progress Notes (Signed)
Continue Vit D supplement Vit D 3 5000 IU daily or 50,000 IU weekly.

## 2022-07-26 ENCOUNTER — Encounter: Payer: Self-pay | Admitting: Family Medicine

## 2022-07-26 LAB — COMPREHENSIVE METABOLIC PANEL
AST: 15 IU/L (ref 0–40)
Alkaline Phosphatase: 55 IU/L (ref 44–121)
BUN/Creatinine Ratio: 15 (ref 9–23)
Bilirubin Total: 0.3 mg/dL (ref 0.0–1.2)

## 2022-07-26 LAB — CBC WITH DIFFERENTIAL/PLATELET
Basophils Absolute: 0.1 10*3/uL (ref 0.0–0.2)
EOS (ABSOLUTE): 0.1 10*3/uL (ref 0.0–0.4)
Hematocrit: 41.8 % (ref 34.0–46.6)
Immature Granulocytes: 0 %
Lymphs: 24 %
Platelets: 245 10*3/uL (ref 150–450)

## 2022-07-26 LAB — LIPID PANEL
Chol/HDL Ratio: 3.8 ratio (ref 0.0–4.4)
Cholesterol, Total: 148 mg/dL (ref 100–199)
Triglycerides: 121 mg/dL (ref 0–149)

## 2022-07-26 LAB — VITAMIN B6

## 2022-07-30 LAB — COMPREHENSIVE METABOLIC PANEL
CO2: 22 mmol/L (ref 20–29)
Glucose: 92 mg/dL (ref 70–99)
Total Protein: 6.5 g/dL (ref 6.0–8.5)

## 2022-07-30 LAB — LIPID PANEL
LDL Chol Calc (NIH): 87 mg/dL (ref 0–99)
VLDL Cholesterol Cal: 22 mg/dL (ref 5–40)

## 2022-07-30 LAB — CBC WITH DIFFERENTIAL/PLATELET
Eos: 1 %
Immature Grans (Abs): 0 10*3/uL (ref 0.0–0.1)
Lymphocytes Absolute: 1.6 10*3/uL (ref 0.7–3.1)
Monocytes Absolute: 0.6 10*3/uL (ref 0.1–0.9)
Monocytes: 8 %
Neutrophils: 66 %

## 2022-07-30 LAB — HEMOGLOBIN A1C
Est. average glucose Bld gHb Est-mCnc: 111 mg/dL
Hgb A1c MFr Bld: 5.5 % (ref 4.8–5.6)

## 2022-07-30 LAB — B12 AND FOLATE PANEL

## 2022-08-04 LAB — COMPREHENSIVE METABOLIC PANEL
ALT: 13 IU/L (ref 0–32)
Albumin/Globulin Ratio: 1.7 (ref 1.2–2.2)
Albumin: 4.1 g/dL (ref 3.9–4.9)
BUN: 13 mg/dL (ref 6–20)
Calcium: 9 mg/dL (ref 8.7–10.2)
Chloride: 101 mmol/L (ref 96–106)
Globulin, Total: 2.4 g/dL (ref 1.5–4.5)
Sodium: 138 mmol/L (ref 134–144)
eGFR: 91 mL/min/{1.73_m2} (ref >59)

## 2022-08-04 LAB — CBC WITH DIFFERENTIAL/PLATELET
Basos: 1 %
MCV: 89 fL (ref 79–97)
Neutrophils Absolute: 4.6 10*3/uL (ref 1.4–7.0)
RBC: 4.7 x10E6/uL (ref 3.77–5.28)

## 2022-10-25 ENCOUNTER — Ambulatory Visit: Payer: 59 | Admitting: Family Medicine

## 2023-01-23 ENCOUNTER — Ambulatory Visit
Admission: RE | Admit: 2023-01-23 | Discharge: 2023-01-23 | Disposition: A | Discharge status: Home / Self Care | Payer: 59 | Source: Ambulatory Visit | Attending: Physician Assistant | Admitting: Physician Assistant

## 2023-01-23 VITALS — BP 142/85 | HR 77 | Temp 98.0°F | Resp 16

## 2023-01-23 DIAGNOSIS — N3000 Acute cystitis without hematuria: Secondary | ICD-10-CM | POA: Diagnosis not present

## 2023-01-23 LAB — POCT URINALYSIS DIP (MANUAL ENTRY)
Bilirubin, UA: NEGATIVE
Blood, UA: NEGATIVE
Glucose, UA: NEGATIVE mg/dL
Ketones, POC UA: NEGATIVE mg/dL
Nitrite, UA: NEGATIVE
Protein Ur, POC: NEGATIVE mg/dL
Spec Grav, UA: 1.01 (ref 1.010–1.025)
Urobilinogen, UA: 0.2 U/dL
pH, UA: 6.5 (ref 5.0–8.0)

## 2023-01-23 MED ORDER — NITROFURANTOIN MONOHYD MACRO 100 MG PO CAPS: 100.0000 mg | ORAL_CAPSULE | Freq: Two times a day (BID) | ORAL | 0 refills | Status: DC

## 2023-01-23 NOTE — ED Triage Notes (Signed)
Fatigue, lower back pain, burning with urination, frequency urination x 3 days. Took azo yesterday and Advil.

## 2023-01-24 LAB — URINE CULTURE: Culture: <10000 — AB

## 2023-02-19 NOTE — ED Provider Notes (Signed)
[facility]    CSN: [id number] Arrival date & time: 01/23/23  1231      History   Chief Complaint Chief Complaint  Patient presents with   Back Pain    I believe I have a uti or kidney infection - Entered by patient   Urinary Frequency    HPI Rebecca Black is a 37 y.o. female.   Patient here today for evaluation of back pain, dysuria, urinary frequency that started 3 days ago.  She does not report fever.  She has not had vomiting.  She has taken Azo and Advil without resolution.  The history is provided by the patient.  Back Pain Associated symptoms: dysuria   Associated symptoms: no abdominal pain and no fever   Urinary Frequency Pertinent negatives include no abdominal pain.    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 No pertinent surgical history.  OB History     Gravida  0   Para  0   Term  0   Preterm  0   AB  0   Living  0      SAB  0   IAB  0   Ectopic  0   Multiple  0   Live Births  0            Home  Medications    Prior to Admission medications   Medication Sig Start Date End Date Taking? Authorizing Provider  nitrofurantoin, macrocrystal-monohydrate, (MACROBID) 100 MG capsule Take 1 capsule (100 mg total) by mouth 2 (two) times daily. 01/23/23  Yes Tomi Bamberger, PA-C  buPROPion (WELLBUTRIN XL) 150 MG 24 hr tablet Take 1 tablet (150 mg total) by mouth daily. 07/24/22   Jacky Kindle, FNP  phentermine 37.5 MG capsule Take 1 capsule (37.5 mg total) by mouth every morning. 07/24/22   Jacky Kindle, FNP    Family History Family History  Problem Relation Age of Onset   Depression Mother    Hypertension Mother    Diabetes Father    Breast cancer Paternal Grandmother    Diabetes Paternal Grandfather     Social History Social History   Tobacco Use   Smoking status: Never   Smokeless tobacco: Never  Substance Use Topics   Alcohol use: Yes    Alcohol/week: 1.0 standard drink of alcohol    Types: 1 Glasses of wine per week   Drug use: No     Allergies   Patient has no known allergies.   Review of Systems Review of Systems  Constitutional:  Negative for chills and fever.  Eyes:  Negative for discharge and redness.  Gastrointestinal:  Negative for abdominal pain, nausea and vomiting.  Genitourinary:  Positive for dysuria and frequency.  Musculoskeletal:  Positive for back pain.     Physical Exam Triage Vital Signs ED Triage Vitals [01/23/23 1327]  Encounter Vitals Group     BP (!) 142/85     Systolic BP Percentile      Diastolic BP Percentile      Pulse Rate 77     Resp 16     Temp 98 F (36.7 C)     Temp Source Oral     SpO2 98 %     Weight      Height      Head Circumference      Peak Flow      Pain Score 5     Pain Loc      Pain Education      Exclude from Growth Chart    No data found.  Updated Vital Signs BP (!) 142/85 (BP Location: Left Arm)   Pulse 77   Temp 98 F (36.7 C) (Oral)   Resp 16   LMP 01/01/2023 (Approximate)   SpO2 98%    Visual Acuity Right Eye Distance:   Left Eye Distance:   Bilateral Distance:    Right Eye Near:   Left Eye Near:    Bilateral Near:     Physical Exam Vitals and nursing note reviewed.  Constitutional:      General: She is not in acute distress.    Appearance: Normal appearance. She is not ill-appearing.  HENT:     Head: Normocephalic and atraumatic.  Eyes:     Conjunctiva/sclera: Conjunctivae normal.  Cardiovascular:     Rate and Rhythm: Normal rate.  Pulmonary:     Effort: Pulmonary effort is normal. No respiratory distress.  Neurological:     Mental Status: She is alert.  Psychiatric:        Mood and Affect: Mood normal.        Behavior: Behavior normal.        Thought Content: Thought content normal.      UC Treatments / Results  Labs (all labs ordered are listed, but only abnormal results are displayed) Labs Reviewed  URINE CULTURE - Abnormal; Notable for the following components:      Result Value   Culture   (*)    Value: <10,000 COLONIES/mL INSIGNIFICANT GROWTH Performed at [facility], [address], [place], [place] [address]    All other components within normal limits  POCT URINALYSIS DIP (MANUAL ENTRY) - Abnormal; Notable for the following components:   Leukocytes, UA Small (1+) (*)    All other components within normal limits    EKG   Radiology No results found.  Procedures Procedures (including critical care time)  Medications Ordered in UC Medications - No data to display  Initial Impression / Assessment and Plan / UC Course  I have reviewed the triage vital signs and the nursing notes.  Pertinent labs & imaging results that were available during my care of the patient were reviewed by me and considered in my medical decision making (see chart for details).    Will treat to cover UTI with Macrobid and urine culture ordered.  Recommended follow-up if no gradual improvement with any further concerns.  Final Clinical  Impressions(s) / UC Diagnoses   Final diagnoses:  Acute cystitis without hematuria   Discharge Instructions   None  ED Prescriptions     Medication Sig Dispense Auth. Provider   nitrofurantoin, macrocrystal-monohydrate, (MACROBID) 100 MG capsule Take 1 capsule (100 mg total) by mouth 2 (two) times daily. 10 capsule Tomi Bamberger, PA-C      PDMP not reviewed this encounter.   Tomi Bamberger, PA-C 02/19/23 [phone]

## 2023-05-16 ENCOUNTER — Ambulatory Visit: Payer: 59

## 2023-05-17 ENCOUNTER — Ambulatory Visit: Payer: 59

## 2023-05-19 ENCOUNTER — Telehealth: Payer: 59 | Admitting: Family Medicine

## 2023-05-19 DIAGNOSIS — R6889 Other general symptoms and signs: Secondary | ICD-10-CM

## 2023-05-19 NOTE — Progress Notes (Signed)
 E visit for Flu like symptoms   We are sorry that you are not feeling well.  Here is how we plan to help! Based on what you have shared with me it looks like you may have a respiratory virus that may be influenza.  Influenza or "the flu" is   an infection caused by a respiratory virus. The flu virus is highly contagious and persons who did not receive their yearly flu vaccination may "catch" the flu from close contact.  We have anti-viral medications to treat the viruses that cause this infection. They are not a "cure" and only shorten the course of the infection. These prescriptions are most effective when they are given within the first 2 days of "flu" symptoms. Antiviral medication are indicated if you have a high risk of complications from the flu. You should  also consider an antiviral medication if you are in close contact with someone who is at risk. These medications can help patients avoid complications from the flu  but have side effects that you should know. Possible side effects from Tamiflu or oseltamivir include nausea, vomiting, diarrhea, dizziness, headaches, eye redness, sleep problems or other respiratory symptoms. You should not take Tamiflu if you have an allergy to oseltamivir or any to the ingredients in Tamiflu.  Based upon your symptoms and potential risk factors I have prescribed Oseltamivir (Tamiflu).  It has been sent to your designated pharmacy.  You will take one 75 mg capsule orally twice a day for the next 5 days. and I recommend that you follow the flu symptoms recommendation that I have listed below.  ANYONE WHO HAS FLU SYMPTOMS SHOULD: Stay home. The flu is highly contagious and going out or to work exposes others! Be sure to drink plenty of fluids. Water is fine as well as fruit juices, sodas and electrolyte beverages. You may want to stay away from caffeine or alcohol. If you are nauseated, try taking small sips of liquids. How do you know if you are getting enough  fluid? Your urine should be a pale yellow or almost colorless. Get rest. Taking a steamy shower or using a humidifier may help nasal congestion and ease sore throat pain. Using a saline nasal spray works much the same way. Cough drops, hard candies and sore throat lozenges may ease your cough. Line up a caregiver. Have someone check on you regularly.   GET HELP RIGHT AWAY IF: You cannot keep down liquids or your medications. You become short of breath Your fell like you are going to pass out or loose consciousness. Your symptoms persist after you have completed your treatment plan MAKE SURE YOU  Understand these instructions. Will watch your condition. Will get help right away if you are not doing well or get worse.  Your e-visit answers were reviewed by a board certified advanced clinical practitioner to complete your personal care plan.  Depending on the condition, your plan could have included both over the counter or prescription medications.  If there is a problem please reply  once you have received a response from your provider.  Your safety is important to [place].  If you have drug allergies check your prescription carefully.    You can use MyChart to ask questions about today's visit, request a non-urgent call back, or ask for a work or school excuse for 24 hours related to this e-Visit. If it has been greater than 24 hours you will need to follow up with your provider, or enter a  new e-Visit to address those concerns.  You will get an e-mail in the next two days asking about your experience.  I hope that your e-visit has been valuable and will speed your recovery. Thank you for using e-visits.  I provided 5 minutes of non face-to-face time during this encounter for chart review, medication and order placement, as well as and documentation.

## 2023-06-26 ENCOUNTER — Other Ambulatory Visit: Payer: Self-pay | Admitting: Physician Assistant

## 2023-06-26 ENCOUNTER — Encounter: Payer: Self-pay | Admitting: Physician Assistant

## 2023-06-26 DIAGNOSIS — R1011 Right upper quadrant pain: Secondary | ICD-10-CM

## 2023-06-30 ENCOUNTER — Other Ambulatory Visit

## 2023-07-01 ENCOUNTER — Ambulatory Visit
Admission: RE | Admit: 2023-07-01 | Discharge: 2023-07-01 | Disposition: A | Discharge status: Home / Self Care | Source: Ambulatory Visit | Attending: Physician Assistant | Admitting: Physician Assistant

## 2023-07-01 DIAGNOSIS — R1011 Right upper quadrant pain: Secondary | ICD-10-CM

## 2023-07-24 ENCOUNTER — Encounter: Payer: Self-pay | Admitting: Physician Assistant

## 2023-09-08 ENCOUNTER — Encounter: Payer: Self-pay | Admitting: Physician Assistant

## 2023-09-08 ENCOUNTER — Ambulatory Visit (INDEPENDENT_AMBULATORY_CARE_PROVIDER_SITE_OTHER): Admitting: Physician Assistant

## 2023-09-08 VITALS — BP 122/68 | HR 84 | Ht 64.0 in | Wt 275.0 lb

## 2023-09-08 DIAGNOSIS — K2960 Other gastritis without bleeding: Secondary | ICD-10-CM

## 2023-09-08 DIAGNOSIS — R1013 Epigastric pain: Secondary | ICD-10-CM | POA: Diagnosis not present

## 2023-09-08 DIAGNOSIS — R1011 Right upper quadrant pain: Secondary | ICD-10-CM

## 2023-09-08 MED ORDER — PANTOPRAZOLE SODIUM 40 MG PO TBEC: 40.0000 mg | DELAYED_RELEASE_TABLET | Freq: Every day | ORAL | 3 refills | Status: AC

## 2023-09-08 NOTE — Patient Instructions (Signed)
 Your provider has ordered Diatherix stool testing for you. You have received a kit from our office today containing all necessary supplies to complete this test. Please carefully read the stool collection instructions provided in the kit before opening the accompanying materials. In addition, be sure there is a label providing your full name and date of birth on the puritan opti-swab tube that is supplied in the kit (if you do not see a label with this information on your test tube, please make us  aware before test collection!). After completing the test, you should secure the purtian tube into the specimen biohazard bag. The [facility] E-Req sheet (including date and time of specimen collection) should be placed into the outside pocket of the specimen biohazard bag and returned to the [place] lab (basement floor of [facility]) within 3 days of collection. Please make sure to give the specimen to a staff member at the lab. DO NOT leave the specimen on the counter.   If the specimen date and time (can be found in the upper right boxed portion of the sheet) are not filled out on the E-Req sheet, the test will NOT be performed.    We have sent the following medications to your pharmacy for you to pick up at your convenience: Pantoprazole  40 mg: Take 1 by mouth daily  We are giving you a GERD handout today.  You have been scheduled for a follow up appointment on August 7th at 8:20am. Please arrive 10 minutes early for registration. If you need to reschedule or cancel this appointment please call [phone] as soon as possible. Thank you.   Thank you for entrusting me with your care and for choosing [facility], [place], [place]   If your blood pressure at your visit was 140/90 or greater, please contact your primary care physician to follow up on this. ______________________________________________________  If you are age 37 or older, your body mass index  should be between 23-30. Your Body mass index is 47.2 kg/m. If this is out of the aforementioned range listed, please consider follow up with your Primary Care Provider.  If you are age 37 or younger, your body mass index should be between 19-25. Your Body mass index is 47.2 kg/m. If this is out of the aformentioned range listed, please consider follow up with your Primary Care Provider.  ________________________________________________________  The [place] providers would like to encourage you to use MYCHART to communicate with providers for non-urgent requests or questions.  Due to long hold times on the telephone, sending your provider a message by [facility] may be a faster and more efficient way to get a response.  Please allow 48 business hours for a response.  Please remember that this is for non-urgent requests.  _______________________________________________________  Due to recent changes in healthcare laws, you may see the results of your imaging and laboratory studies on MyChart before your provider has had a chance to review them.  We understand that in some cases there may be results that are confusing or concerning to you. Not all laboratory results come back in the same time frame and the provider may be waiting for multiple results in order to interpret others.  Please give us  48 hours in order for your provider to thoroughly review all the results before contacting the office for clarification of your results.

## 2023-09-08 NOTE — Progress Notes (Signed)
 Brigitte Canard, PA-C [address] [place], [place]  [address] Phone: [phone]   Gastroenterology Consultation  Referring Provider:     Isabelle Maple, PA-C Primary Care Physician:  Normie Becton, FNP Primary Gastroenterologist:  Brigitte Canard, PA-C / Darol Elizabeth, MD  Reason for Consultation:     Epigastric and RUQ pain        HPI:   Rebecca Black is a 38 y.o. y/o female referred for consultation & management  by Normie Becton, FNP.    New patient.  Here to evaluate Epigastric and RUQ pain.  No previous GI evaluation.  Patient states she has had epigastric pain which radiates to her right upper quadrant for 3 months.  She started taking OTC Prilosec 20 Mg once daily 2 months ago which has helped.  She still has burning in the epigastrium which is worse on empty stomach.  She stopped drinking sodas.  Still drinks 1 or 2 cups of coffee a day.  She goes to work very early and does not eat all day.  Epigastric pain has been worse after eating spaghetti, pizza, spicy or acidic foods.  She denies nausea, vomiting, melena, hematochezia, or weight loss.    06/2023 RUQ ultrasound: Normal gallbladder with no gallstones.  CBD 3.6 mm.  Increased liver echotexture consistent with fatty liver.  No liver lesions.  Recent labs 07/2023: Normal CBC, CMP, and lipase results. She has NOT had a test for H. pylori.  Past Medical History:  Diagnosis Date   Anxiety and depression 07/24/2022   Moderate mixed hyperlipidemia not requiring statin therapy 07/24/2022   Neuropathy 07/24/2022   Obese    Primary hypertension 07/24/2022    History reviewed. No pertinent surgical history.  Prior to Admission medications   Medication Sig Start Date End Date Taking? Authorizing Provider  buPROPion  (WELLBUTRIN  XL) 150 MG 24 hr tablet Take 1 tablet (150 mg total) by mouth daily. 07/24/22   Normie Becton, FNP  nitrofurantoin , macrocrystal-monohydrate, (MACROBID ) 100 MG capsule Take 1 capsule (100 mg  total) by mouth 2 (two) times daily. 01/23/23   Vernestine Gondola, PA-C  phentermine  37.5 MG capsule Take 1 capsule (37.5 mg total) by mouth every morning. 07/24/22   Normie Becton, FNP    Family History  Problem Relation Age of Onset   Depression Mother    Hypertension Mother    Diabetes Father    Breast cancer Paternal Grandmother    Diabetes Paternal Grandfather    Liver disease Neg Hx    Esophageal cancer Neg Hx    Colon cancer Neg Hx      Social History   Tobacco Use   Smoking status: Never   Smokeless tobacco: Never  Vaping Use   Vaping status: Every Day   Substances: Nicotine  Substance Use Topics   Alcohol use: Yes    Alcohol/week: 1.0 standard drink of alcohol    Types: 1 Glasses of wine per week   Drug use: No    Allergies as of 09/08/2023   (No Known Allergies)    Review of Systems:    All systems reviewed and negative except where noted in HPI.   Physical Exam:  BP 122/68   Pulse 84   Ht 5' 4 (1.626 m)   Wt 275 lb (124.7 kg)   BMI 47.20 kg/m  No LMP recorded.  General:   Alert,  Well-developed, well-nourished, pleasant and cooperative in NAD Lungs:  Respirations even  and unlabored.  Clear throughout to auscultation.   No wheezes, crackles, or rhonchi. No acute distress. Heart:  Regular rate and rhythm; no murmurs, clicks, rubs, or gallops. Abdomen:  Normal bowel sounds.  No bruits.  Soft, and non-distended without masses, hepatosplenomegaly or hernias noted.  Moderate epigastric tenderness.  No RUQ tenderness.  No LUQ tenderness.  No lower abdominal tenderness.  No guarding or rebound tenderness.    Neurologic:  Alert and oriented x3;  grossly normal neurologically. Psych:  Alert and cooperative. Normal mood and affect.  Imaging Studies: No results found.  Labs: CBC    Component Value Date/Time   WBC 7.0 07/24/2022 1024   RBC 4.70 07/24/2022 1024   HGB 13.7 07/24/2022 1024   HCT 41.8 07/24/2022 1024   PLT 245 07/24/2022 1024   MCV 89  07/24/2022 1024   MCH 29.1 07/24/2022 1024   MCHC 32.8 07/24/2022 1024   RDW 13.0 07/24/2022 1024   LYMPHSABS 1.6 07/24/2022 1024   EOSABS 0.1 07/24/2022 1024   BASOSABS 0.1 07/24/2022 1024    CMP     Component Value Date/Time   NA 138 07/24/2022 1024   K 4.7 07/24/2022 1024   CL 101 07/24/2022 1024   CO2 22 07/24/2022 1024   GLUCOSE 92 07/24/2022 1024   BUN 13 07/24/2022 1024   CREATININE 0.85 07/24/2022 1024   CALCIUM 9.0 07/24/2022 1024   PROT 6.5 07/24/2022 1024   ALBUMIN 4.1 07/24/2022 1024   AST 15 07/24/2022 1024   ALT 13 07/24/2022 1024   ALKPHOS 55 07/24/2022 1024   BILITOT 0.3 07/24/2022 1024   GFRNONAA 118 03/28/2016 1454   GFRAA 136 03/28/2016 1454    Assessment and Plan:   Rebecca Black is a 38 y.o. y/o female has been referred for:  Epigastric pain.  Symptoms are most consistent with gastritis. - I ordered Diatherix H. pylori stool test. - Stop OTC Prilosec. - Start Rx pantoprazole  40 Mg 1 tablet once daily, #90, 3 refills. - She can also take OTC TUMS if needed. - Recommend Lifestyle Modifications to prevent Acid Reflux.  Rec. Avoid coffee, sodas, peppermint, garlic, onions, alcohol, citrus fruits, chocolate, tomatoes, fatty and spicey foods.  Avoid eating 2-3 hours before bedtime.   - Avoid NSAIDs. -I encouraged her to eat bland foods and eat small frequent meals throughout the day. - If epigastric pain persists in spite of trial of PPI, then EGD will be the next step.  2.  RUQ pain - currently improved.  RUQ ultrasound was negative for gallstones.  Recent CBC, CMP and Lipase lab results were normal. - If she has recurrent RUQ pain, then consider HIDA scan.  Follow up in 6 weeks with TG.  Brigitte Canard, PA-C

## 2023-09-15 NOTE — Progress Notes (Signed)
 Agree with the assessment and plan as outlined by Brigitte Canard, PA-C.

## 2023-09-23 ENCOUNTER — Telehealth: Payer: Self-pay

## 2023-09-23 NOTE — Telephone Encounter (Signed)
 The diatherix stool test was not preformed. Please advise as to if patient should repeat the test.

## 2023-09-30 ENCOUNTER — Encounter: Payer: Self-pay | Admitting: Physician Assistant

## 2023-10-28 NOTE — Progress Notes (Unsigned)
 Rebecca Console, PA-C [address] [place], [place]  [address] Phone: [phone]   Primary Care Physician: Emilio Kelly DASEN, FNP (Inactive)  Primary Gastroenterologist:  Rebecca Console, PA-C / Glendia Holt, MD   Chief Complaint: Follow-up epigastric pain and RUQ pain.       HPI:   Rebecca Black is a 38 y.o. female returns for 6-week follow-up of epigastric pain thought due to gastritis.  Diatherix H. pylori stool test was ordered, however not collected properly and unable to be tested.  6 weeks ago she was switched from Prilosec 20 Mg daily to pantoprazole  40 Mg once daily.  She has been avoiding GERD trigger foods / drinks and NSAIDs.  Epigastric pain has improved, yet not resolved.  She started taking a probiotic.  She still has episodes of epigastric pain.  She completed Diatherix H. pylori stool test twice.  Both results were inconclusive.  RUQ pain has improved.  06/2023 RUQ ultrasound: Normal gallbladder with no gallstones.  CBD 3.6 mm.  Increased liver echotexture consistent with fatty liver.  No liver lesions.   Labs 07/2023: Normal CBC, CMP, and lipase results. She has NOT had a test for H. pylori.  She works for [employer] and does a lot of heavy lifting.  Current Outpatient Medications  Medication Sig Dispense Refill   pantoprazole  (PROTONIX ) 40 MG tablet Take 1 tablet (40 mg total) by mouth daily. 90 tablet 3   No current facility-administered medications for this visit.    Allergies as of 10/29/2023   (No Known Allergies)    Past Medical History:  Diagnosis Date   Anxiety and depression 07/24/2022   Moderate mixed hyperlipidemia not requiring statin therapy 07/24/2022   Neuropathy 07/24/2022   Obese    Primary hypertension 07/24/2022    Past Surgical History:  Procedure Laterality Date   NO PAST SURGERIES      Review of Systems:    All systems reviewed and negative except where noted in HPI.    Physical Exam:  BP (!) 150/70   Pulse 92   Ht  5' 4 (1.626 m)   Wt 270 lb (122.5 kg)   BMI 46.35 kg/m  No LMP recorded.  General: Well-nourished, well-developed, obese,  in no acute distress.  Lungs: Clear to auscultation bilaterally. Non-labored. Heart: Regular rate and rhythm, no murmurs rubs or gallops.  Abdomen: Bowel sounds are normal; Abdomen is Soft; No hepatosplenomegaly, masses or hernias;  Mild to moderate Epigastric Abdominal Tenderness; No RUQ, LUQ or lower abdominal tenderness.  No guarding or rebound tenderness. Neuro: Alert and oriented x 3.  Grossly intact.  Psych: Alert and cooperative, normal mood and affect.  Imaging Studies: No results found.  Labs: CBC    Component Value Date/Time   WBC 7.0 07/24/2022 1024   RBC 4.70 07/24/2022 1024   HGB 13.7 07/24/2022 1024   HCT 41.8 07/24/2022 1024   PLT 245 07/24/2022 1024   MCV 89 07/24/2022 1024   MCH 29.1 07/24/2022 1024   MCHC 32.8 07/24/2022 1024   RDW 13.0 07/24/2022 1024   LYMPHSABS 1.6 07/24/2022 1024   EOSABS 0.1 07/24/2022 1024   BASOSABS 0.1 07/24/2022 1024    CMP     Component Value Date/Time   NA 138 07/24/2022 1024   K 4.7 07/24/2022 1024   CL 101 07/24/2022 1024   CO2 22 07/24/2022 1024   GLUCOSE 92 07/24/2022 1024   BUN 13 07/24/2022 1024   CREATININE 0.85 07/24/2022  1024   CALCIUM 9.0 07/24/2022 1024   PROT 6.5 07/24/2022 1024   ALBUMIN 4.1 07/24/2022 1024   AST 15 07/24/2022 1024   ALT 13 07/24/2022 1024   ALKPHOS 55 07/24/2022 1024   BILITOT 0.3 07/24/2022 1024   GFRNONAA 118 03/28/2016 1454   GFRAA 136 03/28/2016 1454    Assessment and Plan:   [name] is a 38 y.o. y/o female returns for follow-up of:  1.  Epigastric pain Improved, yet persistent on PPI: H. pylori stool test was inconclusive x 2. - Continue pantoprazole  40mg  daily. - Continue to avoid NSAIDS. - Scheduling EGD I discussed risks of EGD with patient to include risk of bleeding, perforation, and risk of sedation.  Patient expressed understanding and  agrees to proceed with EGD.   2.  RUQ pain: RUQ ultrasound showed no gallstones; RUQ pain resolved. - If she has recurrent RUQ pain, then HIDA scan is next step.  Rebecca Console, PA-C  Follow up As needed based on EGD results and GI symptoms.

## 2023-10-29 ENCOUNTER — Encounter: Payer: Self-pay | Admitting: Physician Assistant

## 2023-10-29 ENCOUNTER — Ambulatory Visit (INDEPENDENT_AMBULATORY_CARE_PROVIDER_SITE_OTHER): Admitting: Physician Assistant

## 2023-10-29 VITALS — BP 150/70 | HR 92 | Ht 64.0 in | Wt 270.0 lb

## 2023-10-29 DIAGNOSIS — R1013 Epigastric pain: Secondary | ICD-10-CM

## 2023-10-29 NOTE — Patient Instructions (Signed)
 You have been scheduled for an Endoscopy. Please follow written instructions given to you at your visit today.  If you use inhalers (even only as needed), please bring them with you on the day of your procedure.  If you take any of the following medications, they will need to be adjusted prior to your procedure:   DO NOT TAKE 7 DAYS PRIOR TO TEST- Trulicity (dulaglutide) Ozempic, Wegovy (semaglutide) Mounjaro (tirzepatide) Bydureon Bcise (exanatide extended release)  DO NOT TAKE 1 DAY PRIOR TO YOUR TEST Rybelsus (semaglutide) Adlyxin (lixisenatide) Victoza (liraglutide) Byetta (exanatide) ___________________________________________________________________________  Please follow up sooner if symptoms increase or worsen  Due to recent changes in healthcare laws, you may see the results of your imaging and laboratory studies on MyChart before your provider has had a chance to review them.  We understand that in some cases there may be results that are confusing or concerning to you. Not all laboratory results come back in the same time frame and the provider may be waiting for multiple results in order to interpret others.  Please give us  48 hours in order for your provider to thoroughly review all the results before contacting the office for clarification of your results.   Thank you for trusting me with your gastrointestinal care!   Ellouise Console, PA-C _______________________________________________________  If your blood pressure at your visit was 140/90 or greater, please contact your primary care physician to follow up on this.  _______________________________________________________  If you are age 37 or older, your body mass index should be between 23-30. Your Body mass index is 46.35 kg/m. If this is out of the aforementioned range listed, please consider follow up with your Primary Care Provider.  If you are age 91 or younger, your body mass index should be between 19-25. Your  Body mass index is 46.35 kg/m. If this is out of the aformentioned range listed, please consider follow up with your Primary Care Provider.   ________________________________________________________  The [facility] providers would like to encourage you to use MYCHART to communicate with providers for non-urgent requests or questions.  Due to long hold times on the telephone, sending your provider a message by [facility] may be a faster and more efficient way to get a response.  Please allow 48 business hours for a response.  Please remember that this is for non-urgent requests.  _______________________________________________________

## 2023-10-30 ENCOUNTER — Ambulatory Visit: Admitting: Physician Assistant

## 2023-11-11 NOTE — Progress Notes (Signed)
 Agree with the assessment and plan as outlined by Brigitte Canard, PA-C.

## 2023-11-28 ENCOUNTER — Ambulatory Visit (AMBULATORY_SURGERY_CENTER): Admitting: Gastroenterology

## 2023-11-28 ENCOUNTER — Encounter: Payer: Self-pay | Admitting: Gastroenterology

## 2023-11-28 VITALS — BP 91/56 | HR 70 | Temp 99.0°F | Resp 13 | Ht 64.0 in | Wt 270.0 lb

## 2023-11-28 DIAGNOSIS — K2950 Unspecified chronic gastritis without bleeding: Secondary | ICD-10-CM | POA: Diagnosis present

## 2023-11-28 DIAGNOSIS — K3189 Other diseases of stomach and duodenum: Secondary | ICD-10-CM

## 2023-11-28 DIAGNOSIS — R1013 Epigastric pain: Secondary | ICD-10-CM

## 2023-11-28 MED ORDER — SODIUM CHLORIDE 0.9 % IV SOLN: 500.0000 mL | Freq: Once | INTRAVENOUS | Status: DC

## 2023-11-28 NOTE — Patient Instructions (Signed)
 YOU HAD AN ENDOSCOPIC PROCEDURE TODAY AT THE [facility]:   Refer to the procedure report that was given to you for any specific questions about what was found during the examination.  If the procedure report does not answer your questions, please call your gastroenterologist to clarify.  If you requested that your care partner not be given the details of your procedure findings, then the procedure report has been included in a sealed envelope for you to review at your convenience later.  YOU SHOULD EXPECT: Some feelings of bloating in the abdomen. Passage of more gas than usual.  Walking can help get rid of the air that was put into your GI tract during the procedure and reduce the bloating. If you had a lower endoscopy (such as a colonoscopy or flexible sigmoidoscopy) you may notice spotting of blood in your stool or on the toilet paper. If you underwent a bowel prep for your procedure, you may not have a normal bowel movement for a few days.  Please Note:  You might notice some irritation and congestion in your nose or some drainage.  This is from the oxygen used during your procedure.  There is no need for concern and it should clear up in a day or so.  SYMPTOMS TO REPORT IMMEDIATELY:  Following upper endoscopy (EGD)  Vomiting of blood or coffee ground material  New chest pain or pain under the shoulder blades  Painful or persistently difficult swallowing  New shortness of breath  Fever of 100F or higher  Black, tarry-looking stools  Resume previous diet Continue present medications Await pathology results  For urgent or emergent issues, a gastroenterologist can be reached at any hour by calling [phone]. Do not use MyChart messaging for urgent concerns.    DIET:  We do recommend a small meal at first, but then you may proceed to your regular diet.  Drink plenty of fluids but you should avoid alcoholic beverages for 24 hours.  ACTIVITY:  You should plan to take it  easy for the rest of today and you should NOT DRIVE or use heavy machinery until tomorrow (because of the sedation medicines used during the test).    FOLLOW UP: Our staff will call the number listed on your records the next business day following your procedure.  We will call around 7:15- 8:00 am to check on you and address any questions or concerns that you may have regarding the information given to you following your procedure. If we do not reach you, we will leave a message.     If any biopsies were taken you will be contacted by phone or by letter within the next 1-3 weeks.  Please call us  at [phone] if you have not heard about the biopsies in 3 weeks.    SIGNATURES/CONFIDENTIALITY: You and/or your care partner have signed paperwork which will be entered into your electronic medical record.  These signatures attest to the fact that that the information above on your After Visit Summary has been reviewed and is understood.  Full responsibility of the confidentiality of this discharge information lies with you and/or your care-partner.

## 2023-11-28 NOTE — Progress Notes (Signed)
 History and Physical Interval Note:  11/28/2023 3:38 PM  Rebecca Black  has presented today for endoscopic procedure(s), with the diagnosis of  Encounter Diagnosis  Name Primary?   Abdominal pain, epigastric Yes  .  The various methods of evaluation and treatment have been discussed with the patient and/or family. After consideration of risks, benefits and other options for treatment, the patient has consented to  the endoscopic procedure(s).   The patient's history has been reviewed, patient examined, no change in status, stable for endoscopic procedure(s).  I have reviewed the patient's chart and labs.  Questions were answered to the patient's satisfaction.     Raniah Karan E. Stacia, MD [facility]

## 2023-11-28 NOTE — Progress Notes (Signed)
 Vitals-[facility]  Pt's states no medical or surgical changes since previsit or office visit.

## 2023-11-28 NOTE — Progress Notes (Signed)
 Called to room to assist during endoscopic procedure.  Patient ID and intended procedure confirmed with present staff. Received instructions for my participation in the procedure from the performing physician.

## 2023-11-28 NOTE — Op Note (Signed)
 [facility] Patient Name: Rebecca Black Procedure Date: 11/28/2023 3:43 PM MRN: [id number] Endoscopist: Glendia E. Stacia , MD, [id number] Age: 38 Referring MD:  Date of Birth: Aug 09, 1985 Gender: Female Account #: [id number] Procedure:                Upper GI endoscopy Indications:              Epigastric abdominal pain Medicines:                Monitored Anesthesia Care Procedure:                Pre-Anesthesia Assessment:                           - Prior to the procedure, a History and Physical                            was performed, and patient medications and                            allergies were reviewed. The patient's tolerance of                            previous anesthesia was also reviewed. The risks                            and benefits of the procedure and the sedation                            options and risks were discussed with the patient.                            All questions were answered, and informed consent                            was obtained. Prior Anticoagulants: The patient has                            taken no anticoagulant or antiplatelet agents. ASA                            Grade Assessment: III - A patient with severe                            systemic disease. After reviewing the risks and                            benefits, the patient was deemed in satisfactory                            condition to undergo the procedure.                           After obtaining informed consent, the endoscope was  passed under direct vision. Throughout the                            procedure, the patient's blood pressure, pulse, and                            oxygen saturations were monitored continuously. The                            Olympus scope [id number] was introduced through the                            mouth, and advanced to the second part of duodenum.                            The upper GI  endoscopy was accomplished without                            difficulty. The patient tolerated the procedure                            well. Scope In: Scope Out: Findings:                 The examined esophagus was normal.                           The entire examined stomach was normal. Biopsies                            were taken with a cold forceps for Helicobacter                            pylori testing. Estimated blood loss was minimal.                           The examined duodenum was normal. Complications:            No immediate complications. Estimated Blood Loss:     Estimated blood loss was minimal. Impression:               - Normal esophagus.                           - Normal stomach. Biopsied.                           - Normal examined duodenum.                           - No endoscopic abnormalities to explain abdominal                            pain. Recommendation:           - Patient has a contact number available for  emergencies. The signs and symptoms of potential                            delayed complications were discussed with the                            patient. Return to normal activities tomorrow.                            Written discharge instructions were provided to the                            patient.                           - Resume previous diet.                           - Continue present medications.                           - Await pathology results. Freddrick Gladson E. Stacia, MD 11/28/2023 4:07:22 PM This report has been signed electronically.

## 2023-11-28 NOTE — Progress Notes (Signed)
 Sedate, gd SR, tolerated procedure well, VSS, report to RN

## 2023-12-01 ENCOUNTER — Telehealth: Payer: Self-pay | Admitting: *Deleted

## 2023-12-01 NOTE — Telephone Encounter (Signed)
 Attempted post procedure follow up call.  No answer - LVM.

## 2023-12-04 LAB — SURGICAL PATHOLOGY

## 2023-12-06 ENCOUNTER — Ambulatory Visit: Payer: Self-pay | Admitting: Gastroenterology

## 2023-12-06 NOTE — Progress Notes (Signed)
 [place],  The biopsies taken from your stomach were notable for mild chronic gastritis (inflammation) which is a common finding, but there was no evidence of Helicobacter pylori infection. This common finding is not likely to explain abdominal pain and there is no specific treatment or further evaluation recommended.

## 2024-01-21 ENCOUNTER — Ambulatory Visit

## 2024-01-21 ENCOUNTER — Ambulatory Visit: Admission: EM | Admit: 2024-01-21 | Discharge: 2024-01-21 | Disposition: A | Discharge status: Home / Self Care

## 2024-01-21 ENCOUNTER — Encounter: Payer: Self-pay | Admitting: Emergency Medicine

## 2024-01-21 DIAGNOSIS — M5430 Sciatica, unspecified side: Secondary | ICD-10-CM | POA: Diagnosis not present

## 2024-01-21 MED ORDER — DICLOFENAC SODIUM 50 MG PO TBEC: 50.0000 mg | DELAYED_RELEASE_TABLET | Freq: Two times a day (BID) | ORAL | 1 refills | Status: AC

## 2024-01-21 MED ORDER — BACLOFEN 10 MG PO TABS: 10.0000 mg | ORAL_TABLET | Freq: Three times a day (TID) | ORAL | 0 refills | Status: AC

## 2024-01-21 NOTE — Discharge Instructions (Addendum)
  1. Sciatic leg pain (Primary) - diclofenac (VOLTAREN) 50 MG EC tablet; Take 1 tablet (50 mg total) by mouth 2 (two) times daily.  Dispense: 30 tablet; Refill: 1 - baclofen (LIORESAL) 10 MG tablet; Take 1 tablet (10 mg total) by mouth 3 (three) times daily.  Dispense: 30 each; Refill: 0 - AMB referral to orthopedics for further evaluation and management of possible right sided sciatica. -Continue to monitor symptoms for any change in severity if there is any escalation of current symptoms or development of new symptoms follow-up in ER for further evaluation and management.

## 2024-01-21 NOTE — ED Triage Notes (Signed)
 Pt c/o right side posterior leg pain that runs from hip down to calf for about 1 month. States she is on her feet a lot for work.   Pt is concerned about blood clot in calf.

## 2024-01-21 NOTE — ED Provider Notes (Signed)
 [facility]  Note:  This document was prepared using Dragon voice recognition software and may include unintentional dictation errors.  MRN: [id number] DOB: Jul 14, 1985  Subjective:   Rebecca Black is a 38 y.o. female presenting for right posterior leg pain from the hip to the calf x 1 month.  Patient reports that pain is worse when on her feet for extended periods of time.  Patient reports that she works for [employer] and is not on her feet for long hours every day.  Patient is concern for possible blood clot causing her leg pain.  No fever, severe swelling to the lower leg, redness, warmth, severe pain with palpation.  Patient reports she has been taking ibuprofen with minimal improvement to symptoms.  Patient denies any known injury or trauma to the leg.  Patient denies any past history of sciatica or low back pain.  No current facility-administered medications for this encounter.  Current Outpatient Medications:    baclofen (LIORESAL) 10 MG tablet, Take 1 tablet (10 mg total) by mouth 3 (three) times daily., Disp: 30 each, Rfl: 0   diclofenac (VOLTAREN) 50 MG EC tablet, Take 1 tablet (50 mg total) by mouth 2 (two) times daily., Disp: 30 tablet, Rfl: 1   pantoprazole  (PROTONIX ) 40 MG tablet, Take 1 tablet (40 mg total) by mouth daily., Disp: 90 tablet, Rfl: 3   No Known Allergies  Past Medical History:  Diagnosis Date   Anxiety and depression 07/24/2022   Moderate mixed hyperlipidemia not requiring statin therapy 07/24/2022   Neuropathy 07/24/2022   Obese    Primary hypertension 07/24/2022     Past Surgical History:  Procedure Laterality Date   NO PAST SURGERIES      Family History  Problem Relation Age of Onset   Depression Mother    Hypertension Mother    Diabetes Father    Breast cancer Paternal Grandmother    Diabetes Paternal Grandfather    Liver disease Neg Hx    Esophageal cancer Neg Hx    Colon cancer Neg Hx    Rectal cancer Neg Hx      Social History   Tobacco Use   Smoking status: Never   Smokeless tobacco: Never  Vaping Use   Vaping status: Every Day   Substances: Nicotine  Substance Use Topics   Alcohol use: Yes    Alcohol/week: 1.0 standard drink of alcohol    Types: 1 Glasses of wine per week    Comment: social   Drug use: No    ROS Refer to HPI for ROS details.  Objective:   Vitals: BP 136/79 (BP Location: Right Arm)   Pulse 82   Temp 98.2 F (36.8 C) (Oral)   Resp 16   LMP 01/08/2024 (Approximate)   SpO2 99%   Physical Exam Vitals and nursing note reviewed.  Constitutional:      General: She is not in acute distress.    Appearance: Normal appearance. She is well-developed. She is not ill-appearing or toxic-appearing.  HENT:     Head: Normocephalic and atraumatic.  Cardiovascular:     Rate and Rhythm: Normal rate.  Pulmonary:     Effort: Pulmonary effort is normal. No respiratory distress.  Musculoskeletal:     Left upper leg: No swelling, edema, tenderness or bony tenderness.     Left lower leg: No swelling, tenderness or bony tenderness. No edema.  Skin:    General: Skin is warm and dry.  Neurological:     General:  No focal deficit present.     Mental Status: She is alert and oriented to person, place, and time.  Psychiatric:        Mood and Affect: Mood normal.        Behavior: Behavior normal.     Procedures  No results found for this or any previous visit (from the past 24 hours).  No results found.   Assessment and Plan :     Discharge Instructions       1. Sciatic leg pain (Primary) - diclofenac (VOLTAREN) 50 MG EC tablet; Take 1 tablet (50 mg total) by mouth 2 (two) times daily.  Dispense: 30 tablet; Refill: 1 - baclofen (LIORESAL) 10 MG tablet; Take 1 tablet (10 mg total) by mouth 3 (three) times daily.  Dispense: 30 each; Refill: 0 - AMB referral to orthopedics for further evaluation and management of possible right sided sciatica. -Continue to monitor  symptoms for any change in severity if there is any escalation of current symptoms or development of new symptoms follow-up in ER for further evaluation and management.      Caryle Helgeson B Adelfa Lozito   Annalena Piatt, [place], [place] 01/21/24 [phone]

## 2024-02-16 ENCOUNTER — Telehealth

## 2024-02-16 ENCOUNTER — Telehealth: Admitting: Emergency Medicine

## 2024-02-16 DIAGNOSIS — J019 Acute sinusitis, unspecified: Secondary | ICD-10-CM | POA: Diagnosis not present

## 2024-02-16 DIAGNOSIS — B9689 Other specified bacterial agents as the cause of diseases classified elsewhere: Secondary | ICD-10-CM

## 2024-02-16 MED ORDER — AMOXICILLIN-POT CLAVULANATE 875-125 MG PO TABS: 1.0000 | ORAL_TABLET | Freq: Two times a day (BID) | ORAL | 0 refills | Status: AC

## 2024-02-16 NOTE — Progress Notes (Signed)
 Virtual Visit Consent   Rebecca Black, you are scheduled for a virtual visit with a [facility] provider today. Just as with appointments in the office, your consent must be obtained to participate. Your consent will be active for this visit and any virtual visit you may have with one of our providers in the next 365 days. If you have a MyChart account, a copy of this consent can be sent to you electronically.  As this is a virtual visit, video technology does not allow for your provider to perform a traditional examination. This may limit your provider's ability to fully assess your condition. If your provider identifies any concerns that need to be evaluated in person or the need to arrange testing (such as labs, EKG, etc.), we will make arrangements to do so. Although advances in technology are sophisticated, we cannot ensure that it will always work on either your end or our end. If the connection with a video visit is poor, the visit may have to be switched to a telephone visit. With either a video or telephone visit, we are not always able to ensure that we have a secure connection.  By engaging in this virtual visit, you consent to the provision of healthcare and authorize for your insurance to be billed (if applicable) for the services provided during this visit. Depending on your insurance coverage, you may receive a charge related to this service.  I need to obtain your verbal consent now. Are you willing to proceed with your visit today? Rebecca Black has provided verbal consent on 02/16/2024 for a virtual visit (video or telephone). Jon CHRISTELLA Belt, NP  Date: 02/16/2024 1:50 PM   Virtual Visit via Video Note   I, Jon CHRISTELLA Belt, connected with  Rebecca Black  ([id number], [birth year]) on 02/16/24 at  1:45 PM EST by a video-enabled telemedicine application and verified that I am speaking with the correct person using two identifiers.  Location: Patient: Virtual Visit Location  Patient: Home Provider: Virtual Visit Location Provider: Home Office   I discussed the limitations of evaluation and management by telemedicine and the availability of in person appointments. The patient expressed understanding and agreed to proceed.    History of Present Illness: Rebecca Black is a 38 y.o. who identifies as a female who was assigned female at birth, and is being seen today for   Had a cold 11 days ago, took sudafed, helped some but not enough. Pressure in B max sinuses, still has congestion. Clear nasal drainage that is sometimes bloody.   Also tried Mucinex. No fever or chills. Lots of post nasal drainage, coughing up same sputum as drainage from nose.    Has tried saline spray in nose BID.   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   Primary hypertension 07/24/2022   Annual physical exam 08/28/2021   Encounter for hepatitis C screening test for low risk patient 08/28/2021   Morbid obesity (HCC) 08/28/2021   Spider varicose veins 08/28/2021   Family history of diabetes mellitus in father 08/28/2021   Family history of hypertension in mother 08/28/2021   Positive screening for depression on 9-item Patient Health Questionnaire (PHQ-9) 08/28/2021   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   Epigastric pain 08/28/2021  Allergies: No Known Allergies Medications:  Current Outpatient Medications:    amoxicillin -clavulanate (AUGMENTIN ) 875-125 MG tablet, Take 1 tablet by mouth 2 (two) times daily., Disp: 14 tablet, Rfl: 0   baclofen  (LIORESAL ) 10 MG tablet, Take 1 tablet (10 mg total) by mouth 3 (three) times daily., Disp: 30 each, Rfl: 0   diclofenac  (VOLTAREN ) 50 MG EC tablet, Take 1 tablet (50 mg total) by mouth 2 (two) times daily., Disp: 30  tablet, Rfl: 1   pantoprazole  (PROTONIX ) 40 MG tablet, Take 1 tablet (40 mg total) by mouth daily., Disp: 90 tablet, Rfl: 3  Observations/Objective: Patient is well-developed, well-nourished in no acute distress.  Resting comfortably  at home.  Head is normocephalic, atraumatic.  No labored breathing.  Speech is clear and coherent with logical content.  Patient is alert and oriented at baseline.    Assessment and Plan: 1. Acute bacterial sinusitis (Primary) - amoxicillin -clavulanate (AUGMENTIN ) 875-125 MG tablet; Take 1 tablet by mouth 2 (two) times daily.  Dispense: 14 tablet; Refill: 0  Sick for >10 days and not improving. Increase saline spray to 4x/day. Continue mucinex. Rx augmentin   Follow Up Instructions: I discussed the assessment and treatment plan with the patient. The patient was provided an opportunity to ask questions and all were answered. The patient agreed with the plan and demonstrated an understanding of the instructions.  A copy of instructions were sent to the patient via MyChart unless otherwise noted below.   The patient was advised to call back or seek an in-person evaluation if the symptoms worsen or if the condition fails to improve as anticipated.    Jon CHRISTELLA Belt, NP

## 2024-02-16 NOTE — Patient Instructions (Signed)
  Rebecca Black, thank you for joining Rebecca CHRISTELLA Belt, NP for today's virtual visit.  While this provider is not your primary care provider (PCP), if your PCP is located in our provider database this encounter information will be shared with them immediately following your visit.   A [facility] MyChart account gives you access to today's visit and all your visits, tests, and labs performed at [facility]  click here if you don't have a [facility] MyChart account or go to [email or web address]  Consent: (Patient) Rebecca Black provided verbal consent for this virtual visit at the beginning of the encounter.  Current Medications:  Current Outpatient Medications:    amoxicillin -clavulanate (AUGMENTIN ) 875-125 MG tablet, Take 1 tablet by mouth 2 (two) times daily., Disp: 14 tablet, Rfl: 0   baclofen  (LIORESAL ) 10 MG tablet, Take 1 tablet (10 mg total) by mouth 3 (three) times daily., Disp: 30 each, Rfl: 0   diclofenac  (VOLTAREN ) 50 MG EC tablet, Take 1 tablet (50 mg total) by mouth 2 (two) times daily., Disp: 30 tablet, Rfl: 1   pantoprazole  (PROTONIX ) 40 MG tablet, Take 1 tablet (40 mg total) by mouth daily., Disp: 90 tablet, Rfl: 3   Medications ordered in this encounter:  Meds ordered this encounter  Medications   amoxicillin -clavulanate (AUGMENTIN ) 875-125 MG tablet    Sig: Take 1 tablet by mouth 2 (two) times daily.    Dispense:  14 tablet    Refill:  0     *If you need refills on other medications prior to your next appointment, please contact your pharmacy*  Follow-Up: Call back or seek an in-person evaluation if the symptoms worsen or if the condition fails to improve as anticipated.  [facility] [phone]  Other Instructions Increase using the nasal saline spray to 4 times a day. Continue Mucinex. Continue drinking lots of liquids. If sudafed is helping, ok to to continue it.     If you have been instructed to have an in-person  evaluation today at a local Urgent Care facility, please use the link below. It will take you to a list of all of our available [facility], including address, phone number and hours of operation. Please do not delay care.  [facility]  If you or a family member do not have a primary care provider, use the link below to schedule a visit and establish care. When you choose a [facility] primary care physician or advanced practice provider, you gain a long-term partner in health. Find a Primary Care Provider  Learn more about [facility]'s in-office and virtual care options: [facility] [place] - Get Care Now

## 2024-05-25 ENCOUNTER — Ambulatory Visit: Admitting: Family Medicine
